# Patient Record
Sex: Male | Born: 1935 | Race: White | Hispanic: No | Smoking: Former smoker
Health system: Southern US, Community
[De-identification: ages and names within clinical notes are randomized; demographics above are authoritative.]

## PROBLEM LIST (undated history)

## (undated) DIAGNOSIS — E079 Disorder of thyroid, unspecified: Secondary | ICD-10-CM

## (undated) DIAGNOSIS — I714 Abdominal aortic aneurysm, without rupture, unspecified: Secondary | ICD-10-CM

## (undated) DIAGNOSIS — R972 Elevated prostate specific antigen [PSA]: Secondary | ICD-10-CM

## (undated) DIAGNOSIS — I498 Other specified cardiac arrhythmias: Secondary | ICD-10-CM

## (undated) DIAGNOSIS — E119 Type 2 diabetes mellitus without complications: Secondary | ICD-10-CM

## (undated) DIAGNOSIS — F0390 Unspecified dementia without behavioral disturbance: Secondary | ICD-10-CM

## (undated) DIAGNOSIS — N400 Enlarged prostate without lower urinary tract symptoms: Secondary | ICD-10-CM

## (undated) DIAGNOSIS — G90A Postural orthostatic tachycardia syndrome (POTS): Secondary | ICD-10-CM

## (undated) DIAGNOSIS — I10 Essential (primary) hypertension: Secondary | ICD-10-CM

## (undated) HISTORY — DX: Essential (primary) hypertension: I10

## (undated) HISTORY — DX: Type 2 diabetes mellitus without complications: E11.9

## (undated) HISTORY — DX: Benign prostatic hyperplasia without lower urinary tract symptoms: N40.0

## (undated) HISTORY — DX: Postural orthostatic tachycardia syndrome (POTS): G90.A

## (undated) HISTORY — DX: Unspecified dementia, unspecified severity, without behavioral disturbance, psychotic disturbance, mood disturbance, and anxiety: F03.90

## (undated) HISTORY — PX: ABDOMINAL AORTIC ANEURYSM REPAIR: SUR1152

## (undated) HISTORY — PX: LUMBAR LAMINECTOMY: SHX95

## (undated) HISTORY — PX: OTHER SURGICAL HISTORY: SHX169

## (undated) HISTORY — PX: APPENDECTOMY: SHX54

## (undated) HISTORY — DX: Other specified cardiac arrhythmias: I49.8

## (undated) HISTORY — DX: Abdominal aortic aneurysm, without rupture, unspecified: I71.40

## (undated) HISTORY — DX: Abdominal aortic aneurysm, without rupture: I71.4

## (undated) HISTORY — DX: Disorder of thyroid, unspecified: E07.9

## (undated) HISTORY — DX: Elevated prostate specific antigen (PSA): R97.20

## (undated) NOTE — *Deleted (*Deleted)
[facility] EMERGENCY DEPARTMENT Provider Note   CSN: [id number] Arrival date & time: 09/02/20  1234     History Chief Complaint  Patient presents with  . Fall    Joe Peters is a 53 y.o. male.  HPI     Past Medical History:  Diagnosis Date  . AAA (abdominal aortic aneurysm) (HCC)   . BPH (benign prostatic hyperplasia)   . Dementia (HCC)   . Diabetes mellitus without complication (HCC)   . Elevated PSA   . Hypertension   . POTS (postural orthostatic tachycardia syndrome)   . Thyroid disease     Patient Active Problem List   Diagnosis Date Noted  . Advanced care planning/counseling discussion   . Goals of care, counseling/discussion   . Palliative care by specialist   . Pressure injury of skin 05/08/2020  . New onset atrial fibrillation (HCC) 05/07/2020  . POTS (postural orthostatic tachycardia syndrome) 05/07/2020  . BPH with urinary obstruction 05/07/2020  . Failure to thrive in adult 05/07/2020  . TIA (transient ischemic attack) 03/04/2020  . HTN (hypertension) 03/04/2020  . DM (diabetes mellitus), secondary, uncontrolled, with neurologic complications (HCC) 03/04/2020    Past Surgical History:  Procedure Laterality Date  . ABDOMINAL AORTIC ANEURYSM REPAIR    . APPENDECTOMY    . IR ANGIO INTRA EXTRACRAN SEL INTERNAL CAROTID BILAT MOD SED  03/07/2020  . IR ANGIO VERTEBRAL SEL SUBCLAVIAN INNOMINATE UNI R MOD SED  03/07/2020  . IR ANGIO VERTEBRAL SEL VERTEBRAL UNI L MOD SED  03/07/2020  . IR US GUIDE VASC ACCESS RIGHT  03/07/2020  . LUMBAR LAMINECTOMY    . peptic ulcer repair         Family History  Problem Relation Age of Onset  . Hypertension Mother   . Hypertension Father     Social History   Tobacco Use  . Smoking status: Former Smoker    Packs/day: 1.00    Years: 25.00    Pack years: 25.00    Types: Cigarettes    Quit date: 12/25/2019    Years since quitting: 0.6  . Smokeless tobacco: Never Used  Vaping Use  . Vaping  Use: Never used  Substance Use Topics  . Alcohol use: Not Currently  . Drug use: Never    Home Medications Prior to Admission medications   Medication Sig Start Date End Date Taking? Authorizing Provider  cilostazol (PLETAL) 50 MG tablet Take 50 mg by mouth 2 (two) times daily.   Yes [provider]  donepezil (ARICEPT) 10 MG tablet Take 10 mg by mouth at bedtime.   Yes [provider]  fenofibrate 54 MG tablet Take 54 mg by mouth daily.   Yes [provider]  gabapentin (NEURONTIN) 100 MG capsule Take 100 mg by mouth 2 (two) times daily.   Yes [provider]  levothyroxine (SYNTHROID) 50 MCG tablet Take 50 mcg by mouth daily before breakfast.   Yes [provider]  simvastatin (ZOCOR) 40 MG tablet Take 40 mg by mouth daily.   Yes [provider]  tamsulosin (FLOMAX) 0.4 MG CAPS capsule Take 0.4 mg by mouth daily.   Yes [provider]  feeding supplement, ENSURE ENLIVE, (ENSURE ENLIVE) LIQD Take 237 mLs by mouth 2 (two) times daily between meals. 05/09/20   Arrien, York Ram, MD  fludrocortisone (FLORINEF) 0.1 MG tablet Take 0.1 mg by mouth daily.  04/22/20   [provider]  levETIRAcetam (KEPPRA) 500 MG tablet Take 1 tablet (  500 mg total) by mouth 2 (two) times daily. 09/02/20   Jacalyn Lefevre, MD  metFORMIN (GLUCOPHAGE) 500 MG tablet Take 500 mg by mouth 2 (two) times daily with a meal.     [provider]  metoprolol tartrate (LOPRESSOR) 25 MG tablet Take 0.5 tablets (12.5 mg total) by mouth 2 (two) times daily. 05/09/20 11/22/20  Arrien, York Ram, MD  sertraline (ZOLOFT) 100 MG tablet Take 100 mg by mouth at bedtime.  04/22/20   [provider]    Allergies    Penicillins and Sulfa antibiotics  Review of Systems   Review of Systems  Physical Exam Updated Vital Signs BP (!) 150/88   Pulse (!) 117   Temp (!) 97.1 F (36.2 C) (Rectal)   Resp 16   SpO2 95%   Physical Exam  ED  Results / Procedures / Treatments   Labs (all labs ordered are listed, but only abnormal results are displayed) Labs Reviewed  CBC WITH DIFFERENTIAL/PLATELET - Abnormal; Notable for the following components:      Result Value   WBC 10.6 (*)    RBC 3.97 (*)    Hemoglobin 12.3 (*)    Neutro Abs 8.2 (*)    All other components within normal limits  COMPREHENSIVE METABOLIC PANEL - Abnormal; Notable for the following components:   Glucose, Bld 199 (*)    Calcium 8.7 (*)    Total Protein 5.9 (*)    Albumin 3.1 (*)    GFR, Estimated 59 (*)    Anion gap 16 (*)    All other components within normal limits  MAGNESIUM - Abnormal; Notable for the following components:   Magnesium 1.4 (*)    All other components within normal limits  URINALYSIS, ROUTINE W REFLEX MICROSCOPIC - Abnormal; Notable for the following components:   APPearance HAZY (*)    Hgb urine dipstick SMALL (*)    Protein, ur 100 (*)    Leukocytes,Ua SMALL (*)    Bacteria, UA MANY (*)    All other components within normal limits  CK - Abnormal; Notable for the following components:   Total CK 37 (*)    All other components within normal limits  LACTIC ACID, PLASMA - Abnormal; Notable for the following components:   Lactic Acid, Venous 6.9 (*)    All other components within normal limits  CBG MONITORING, ED - Abnormal; Notable for the following components:   Glucose-Capillary 176 (*)    All other components within normal limits  RESPIRATORY PANEL BY RT PCR (FLU A&B, COVID)  PROTIME-INR    EKG EKG Interpretation  Date/Time:  Monday September 02 2020 12:37:05 EST Ventricular Rate:  119 PR Interval:    QRS Duration: 139 QT Interval:  392 QTC Calculation: 552 R Axis:   -86 Text Interpretation: Junctional tachycardia RBBB and LAFB Since last tracing rate faster Confirmed by Jacalyn Lefevre [phone]) on 09/02/2020 1:19:23 PM   Radiology CT HEAD WO CONTRAST  Result Date: 09/02/2020 CLINICAL DATA:  New onset seizure  activity today. EXAM: CT HEAD WITHOUT CONTRAST CT CERVICAL SPINE WITHOUT CONTRAST TECHNIQUE: Multidetector CT imaging of the head and cervical spine was performed following the standard protocol without intravenous contrast. Multiplanar CT image reconstructions of the cervical spine were also generated. COMPARISON:  Head CT scan 05/07/2020. FINDINGS: CT HEAD FINDINGS Brain: There is an extra-axial collection over the right cerebral convexities measuring up to 1.4 cm in diameter. The collection is new since the prior head CT but predominantly low  attenuating but does have a few small hyperattenuating areas. The collection results in mass effect on the right convexities but there is no right to left midline shift. No evidence of acute infarct or mass lesion is present. Vascular: Atherosclerosis noted. Skull: Intact.  No focal lesion. Sinuses/Orbits: Status post cataract surgery. Mild mucosal thickening in the maxillary sinuses is seen. The patient is status post maxillary antrostomy. Other: None. CT CERVICAL SPINE FINDINGS Alignment: Reversal of the normal cervical lordosis and 0.2 cm anterolisthesis C7 on T1 due to facet arthropathy noted. Skull base and vertebrae: No acute fracture. No primary bone lesion or focal pathologic process. Soft tissues and spinal canal: No prevertebral fluid or swelling. No visible canal hematoma. Disc levels:  Marked multilevel loss of disc space height. Upper chest: Lung apices demonstrate emphysematous disease. Other: None. IMPRESSION: Subdural hemorrhage on the right appears mainly subacute although there are some areas of acute appearing hemorrhage within it. No right to left midline shift or hydrocephalus. No other acute finding head or cervical spine. Atrophy and chronic microvascular ischemic change. Multilevel cervical spondylosis. Emphysema (ICD10-J43.9). Critical Value/emergent results were called by telephone at the time of interpretation on 09/02/2020 at 1:44 pm to provider  JULIE HAVILAND , who verbally acknowledged these results. Electronically Signed   By: Drusilla Kanner M.D.   On: 09/02/2020 13:53   CT CERVICAL SPINE WO CONTRAST  Result Date: 09/02/2020 CLINICAL DATA:  New onset seizure activity today. EXAM: CT HEAD WITHOUT CONTRAST CT CERVICAL SPINE WITHOUT CONTRAST TECHNIQUE: Multidetector CT imaging of the head and cervical spine was performed following the standard protocol without intravenous contrast. Multiplanar CT image reconstructions of the cervical spine were also generated. COMPARISON:  Head CT scan 05/07/2020. FINDINGS: CT HEAD FINDINGS Brain: There is an extra-axial collection over the right cerebral convexities measuring up to 1.4 cm in diameter. The collection is new since the prior head CT but predominantly low attenuating but does have a few small hyperattenuating areas. The collection results in mass effect on the right convexities but there is no right to left midline shift. No evidence of acute infarct or mass lesion is present. Vascular: Atherosclerosis noted. Skull: Intact.  No focal lesion. Sinuses/Orbits: Status post cataract surgery. Mild mucosal thickening in the maxillary sinuses is seen. The patient is status post maxillary antrostomy. Other: None. CT CERVICAL SPINE FINDINGS Alignment: Reversal of the normal cervical lordosis and 0.2 cm anterolisthesis C7 on T1 due to facet arthropathy noted. Skull base and vertebrae: No acute fracture. No primary bone lesion or focal pathologic process. Soft tissues and spinal canal: No prevertebral fluid or swelling. No visible canal hematoma. Disc levels:  Marked multilevel loss of disc space height. Upper chest: Lung apices demonstrate emphysematous disease. Other: None. IMPRESSION: Subdural hemorrhage on the right appears mainly subacute although there are some areas of acute appearing hemorrhage within it. No right to left midline shift or hydrocephalus. No other acute finding head or cervical spine. Atrophy  and chronic microvascular ischemic change. Multilevel cervical spondylosis. Emphysema (ICD10-J43.9). Critical Value/emergent results were called by telephone at the time of interpretation on 09/02/2020 at 1:44 pm to provider JULIE HAVILAND , who verbally acknowledged these results. Electronically Signed   By: Drusilla Kanner M.D.   On: 09/02/2020 13:53    Procedures Procedures (including critical care time)  Medications Ordered in ED Medications  0.9 %  sodium chloride infusion ( Intravenous New Bag/Given 09/02/20 1257)  LORazepam (ATIVAN) 2 MG/ML injection (1 mg  Given 09/02/20 1252)  levETIRAcetam (KEPPRA) IVPB 1000 mg/100 mL premix (0 mg Intravenous Stopped 09/02/20 1404)    ED Course  I have reviewed the triage vital signs and the nursing notes.  Pertinent labs & imaging results that were available during my care of the patient were reviewed by me and considered in my medical decision making (see chart for details).    MDM Rules/Calculators/A&P                          *** Final Clinical Impression(s) / ED Diagnoses Final diagnoses:  Seizure (HCC)  Subdural hematoma (HCC)  Hospice care    Rx / DC Orders ED Discharge Orders         Ordered    levETIRAcetam (KEPPRA) 500 MG tablet  2 times daily        09/02/20 1500

---

## 2020-03-04 ENCOUNTER — Observation Stay (HOSPITAL_COMMUNITY): Payer: Medicare Other

## 2020-03-04 ENCOUNTER — Encounter (HOSPITAL_COMMUNITY): Payer: Self-pay | Admitting: Radiology

## 2020-03-04 ENCOUNTER — Emergency Department (HOSPITAL_COMMUNITY): Payer: Medicare Other

## 2020-03-04 ENCOUNTER — Inpatient Hospital Stay (HOSPITAL_COMMUNITY)
Admission: EM | Admit: 2020-03-04 | Discharge: 2020-03-10 | DRG: 069 | Disposition: A | Discharge status: Skilled Nursing Facility | Payer: Medicare Other | Source: Skilled Nursing Facility | Attending: Internal Medicine | Admitting: Internal Medicine

## 2020-03-04 DIAGNOSIS — I672 Cerebral atherosclerosis: Secondary | ICD-10-CM | POA: Diagnosis present

## 2020-03-04 DIAGNOSIS — E871 Hypo-osmolality and hyponatremia: Secondary | ICD-10-CM | POA: Diagnosis present

## 2020-03-04 DIAGNOSIS — Z880 Allergy status to penicillin: Secondary | ICD-10-CM

## 2020-03-04 DIAGNOSIS — E785 Hyperlipidemia, unspecified: Secondary | ICD-10-CM | POA: Diagnosis present

## 2020-03-04 DIAGNOSIS — R260 Ataxic gait: Secondary | ICD-10-CM | POA: Diagnosis present

## 2020-03-04 DIAGNOSIS — Z882 Allergy status to sulfonamides status: Secondary | ICD-10-CM

## 2020-03-04 DIAGNOSIS — Z79890 Hormone replacement therapy (postmenopausal): Secondary | ICD-10-CM

## 2020-03-04 DIAGNOSIS — I361 Nonrheumatic tricuspid (valve) insufficiency: Secondary | ICD-10-CM

## 2020-03-04 DIAGNOSIS — F419 Anxiety disorder, unspecified: Secondary | ICD-10-CM | POA: Diagnosis present

## 2020-03-04 DIAGNOSIS — E1142 Type 2 diabetes mellitus with diabetic polyneuropathy: Secondary | ICD-10-CM | POA: Diagnosis present

## 2020-03-04 DIAGNOSIS — R338 Other retention of urine: Secondary | ICD-10-CM | POA: Diagnosis present

## 2020-03-04 DIAGNOSIS — Z794 Long term (current) use of insulin: Secondary | ICD-10-CM

## 2020-03-04 DIAGNOSIS — G9340 Encephalopathy, unspecified: Secondary | ICD-10-CM | POA: Diagnosis present

## 2020-03-04 DIAGNOSIS — G459 Transient cerebral ischemic attack, unspecified: Secondary | ICD-10-CM | POA: Diagnosis not present

## 2020-03-04 DIAGNOSIS — I351 Nonrheumatic aortic (valve) insufficiency: Secondary | ICD-10-CM

## 2020-03-04 DIAGNOSIS — E1165 Type 2 diabetes mellitus with hyperglycemia: Secondary | ICD-10-CM | POA: Diagnosis present

## 2020-03-04 DIAGNOSIS — F329 Major depressive disorder, single episode, unspecified: Secondary | ICD-10-CM | POA: Diagnosis present

## 2020-03-04 DIAGNOSIS — R55 Syncope and collapse: Secondary | ICD-10-CM

## 2020-03-04 DIAGNOSIS — E1349 Other specified diabetes mellitus with other diabetic neurological complication: Secondary | ICD-10-CM

## 2020-03-04 DIAGNOSIS — Z9689 Presence of other specified functional implants: Secondary | ICD-10-CM | POA: Diagnosis present

## 2020-03-04 DIAGNOSIS — E1151 Type 2 diabetes mellitus with diabetic peripheral angiopathy without gangrene: Secondary | ICD-10-CM | POA: Diagnosis present

## 2020-03-04 DIAGNOSIS — F0390 Unspecified dementia without behavioral disturbance: Secondary | ICD-10-CM | POA: Diagnosis present

## 2020-03-04 DIAGNOSIS — Z20822 Contact with and (suspected) exposure to covid-19: Secondary | ICD-10-CM | POA: Diagnosis present

## 2020-03-04 DIAGNOSIS — R296 Repeated falls: Secondary | ICD-10-CM | POA: Diagnosis present

## 2020-03-04 DIAGNOSIS — R4781 Slurred speech: Secondary | ICD-10-CM | POA: Diagnosis not present

## 2020-03-04 DIAGNOSIS — E7800 Pure hypercholesterolemia, unspecified: Secondary | ICD-10-CM | POA: Diagnosis present

## 2020-03-04 DIAGNOSIS — E1365 Other specified diabetes mellitus with hyperglycemia: Secondary | ICD-10-CM

## 2020-03-04 DIAGNOSIS — Z8679 Personal history of other diseases of the circulatory system: Secondary | ICD-10-CM

## 2020-03-04 DIAGNOSIS — R471 Dysarthria and anarthria: Secondary | ICD-10-CM | POA: Diagnosis present

## 2020-03-04 DIAGNOSIS — I129 Hypertensive chronic kidney disease with stage 1 through stage 4 chronic kidney disease, or unspecified chronic kidney disease: Secondary | ICD-10-CM | POA: Diagnosis present

## 2020-03-04 DIAGNOSIS — N1831 Chronic kidney disease, stage 3a: Secondary | ICD-10-CM | POA: Diagnosis present

## 2020-03-04 DIAGNOSIS — Z7982 Long term (current) use of aspirin: Secondary | ICD-10-CM

## 2020-03-04 DIAGNOSIS — N179 Acute kidney failure, unspecified: Secondary | ICD-10-CM | POA: Diagnosis present

## 2020-03-04 DIAGNOSIS — I10 Essential (primary) hypertension: Secondary | ICD-10-CM

## 2020-03-04 DIAGNOSIS — Z6820 Body mass index (BMI) 20.0-20.9, adult: Secondary | ICD-10-CM

## 2020-03-04 DIAGNOSIS — E861 Hypovolemia: Secondary | ICD-10-CM | POA: Diagnosis present

## 2020-03-04 DIAGNOSIS — R2689 Other abnormalities of gait and mobility: Secondary | ICD-10-CM | POA: Diagnosis present

## 2020-03-04 DIAGNOSIS — R000 Tachycardia, unspecified: Secondary | ICD-10-CM | POA: Diagnosis present

## 2020-03-04 DIAGNOSIS — Z79899 Other long term (current) drug therapy: Secondary | ICD-10-CM

## 2020-03-04 DIAGNOSIS — E1122 Type 2 diabetes mellitus with diabetic chronic kidney disease: Secondary | ICD-10-CM | POA: Diagnosis present

## 2020-03-04 DIAGNOSIS — Z8673 Personal history of transient ischemic attack (TIA), and cerebral infarction without residual deficits: Secondary | ICD-10-CM

## 2020-03-04 DIAGNOSIS — IMO0002 Reserved for concepts with insufficient information to code with codable children: Secondary | ICD-10-CM

## 2020-03-04 DIAGNOSIS — N401 Enlarged prostate with lower urinary tract symptoms: Secondary | ICD-10-CM | POA: Diagnosis present

## 2020-03-04 DIAGNOSIS — R29810 Facial weakness: Secondary | ICD-10-CM | POA: Diagnosis present

## 2020-03-04 DIAGNOSIS — Z8249 Family history of ischemic heart disease and other diseases of the circulatory system: Secondary | ICD-10-CM

## 2020-03-04 DIAGNOSIS — E039 Hypothyroidism, unspecified: Secondary | ICD-10-CM | POA: Diagnosis present

## 2020-03-04 LAB — COMPREHENSIVE METABOLIC PANEL
ALT: 13 U/L (ref 0–44)
AST: 17 U/L (ref 15–41)
Albumin: 3.4 g/dL — ABNORMAL LOW (ref 3.5–5.0)
Alkaline Phosphatase: 36 U/L — ABNORMAL LOW (ref 38–126)
Anion gap: 13 (ref 5–15)
BUN: 18 mg/dL (ref 8–23)
CO2: 23 mmol/L (ref 22–32)
Calcium: 9.5 mg/dL (ref 8.9–10.3)
Chloride: 94 mmol/L — ABNORMAL LOW (ref 98–111)
Creatinine, Ser: 1.52 mg/dL — ABNORMAL HIGH (ref 0.61–1.24)
GFR calc Af Amer: 46 mL/min — ABNORMAL LOW (ref >60)
GFR calc non Af Amer: 40 mL/min — ABNORMAL LOW (ref >60)
Glucose, Bld: 259 mg/dL — ABNORMAL HIGH (ref 70–99)
Potassium: 4.2 mmol/L (ref 3.5–5.1)
Sodium: 130 mmol/L — ABNORMAL LOW (ref 135–145)
Total Bilirubin: 1.3 mg/dL — ABNORMAL HIGH (ref 0.3–1.2)
Total Protein: 5.9 g/dL — ABNORMAL LOW (ref 6.5–8.1)

## 2020-03-04 LAB — GLUCOSE, CAPILLARY: Glucose-Capillary: 151 mg/dL — ABNORMAL HIGH (ref 70–99)

## 2020-03-04 LAB — DIFFERENTIAL
Abs Immature Granulocytes: 0.03 10*3/uL (ref 0.00–0.07)
Basophils Absolute: 0.1 10*3/uL (ref 0.0–0.1)
Basophils Relative: 1 %
Eosinophils Absolute: 0.1 10*3/uL (ref 0.0–0.5)
Eosinophils Relative: 1 %
Immature Granulocytes: 0 %
Lymphocytes Relative: 24 %
Lymphs Abs: 1.7 10*3/uL (ref 0.7–4.0)
Monocytes Absolute: 0.5 10*3/uL (ref 0.1–1.0)
Monocytes Relative: 8 %
Neutro Abs: 4.7 10*3/uL (ref 1.7–7.7)
Neutrophils Relative %: 66 %

## 2020-03-04 LAB — CBC
HCT: 39 % (ref 39.0–52.0)
Hemoglobin: 12.9 g/dL — ABNORMAL LOW (ref 13.0–17.0)
MCH: 31.2 pg (ref 26.0–34.0)
MCHC: 33.1 g/dL (ref 30.0–36.0)
MCV: 94.2 fL (ref 80.0–100.0)
Platelets: 208 10*3/uL (ref 150–400)
RBC: 4.14 MIL/uL — ABNORMAL LOW (ref 4.22–5.81)
RDW: 13.3 % (ref 11.5–15.5)
WBC: 7.1 10*3/uL (ref 4.0–10.5)
nRBC: 0 % (ref 0.0–0.2)

## 2020-03-04 LAB — I-STAT CHEM 8, ED
BUN: 20 mg/dL (ref 8–23)
Calcium, Ion: 1.22 mmol/L (ref 1.15–1.40)
Chloride: 92 mmol/L — ABNORMAL LOW (ref 98–111)
Creatinine, Ser: 1.5 mg/dL — ABNORMAL HIGH (ref 0.61–1.24)
Glucose, Bld: 252 mg/dL — ABNORMAL HIGH (ref 70–99)
HCT: 38 % — ABNORMAL LOW (ref 39.0–52.0)
Hemoglobin: 12.9 g/dL — ABNORMAL LOW (ref 13.0–17.0)
Potassium: 4.1 mmol/L (ref 3.5–5.1)
Sodium: 128 mmol/L — ABNORMAL LOW (ref 135–145)
TCO2: 27 mmol/L (ref 22–32)

## 2020-03-04 LAB — SARS CORONAVIRUS 2 BY RT PCR (HOSPITAL ORDER, PERFORMED IN ~~LOC~~ HOSPITAL LAB): SARS Coronavirus 2: NEGATIVE

## 2020-03-04 LAB — ECHOCARDIOGRAM COMPLETE: Weight: 2366.86 oz

## 2020-03-04 LAB — ETHANOL: Alcohol, Ethyl (B): <10 mg/dL (ref <10)

## 2020-03-04 LAB — PROTIME-INR
INR: 1.1 (ref 0.8–1.2)
Prothrombin Time: 13.5 seconds (ref 11.4–15.2)

## 2020-03-04 LAB — CBG MONITORING, ED: Glucose-Capillary: 169 mg/dL — ABNORMAL HIGH (ref 70–99)

## 2020-03-04 LAB — APTT: aPTT: 27 seconds (ref 24–36)

## 2020-03-04 MED ORDER — DONEPEZIL HCL 10 MG PO TABS
10.0000 mg | ORAL_TABLET | Freq: Every day | ORAL | Status: DC
  Administered 2020-03-05 – 2020-03-09 (×5): 10 mg via ORAL
  Filled 2020-03-04 (×5): qty 1

## 2020-03-04 MED ORDER — SENNOSIDES-DOCUSATE SODIUM 8.6-50 MG PO TABS: 1.0000 | ORAL_TABLET | Freq: Every evening | ORAL | Status: DC | PRN

## 2020-03-04 MED ORDER — GABAPENTIN 100 MG PO CAPS
100.0000 mg | ORAL_CAPSULE | Freq: Two times a day (BID) | ORAL | Status: DC
  Administered 2020-03-04 – 2020-03-10 (×12): 100 mg via ORAL
  Filled 2020-03-04 (×12): qty 1

## 2020-03-04 MED ORDER — SERTRALINE HCL 50 MG PO TABS
50.0000 mg | ORAL_TABLET | Freq: Every day | ORAL | Status: DC
  Administered 2020-03-05 – 2020-03-10 (×6): 50 mg via ORAL
  Filled 2020-03-04 (×6): qty 1

## 2020-03-04 MED ORDER — ACETAMINOPHEN 650 MG RE SUPP: 650.0000 mg | RECTAL | Status: DC | PRN

## 2020-03-04 MED ORDER — IOHEXOL 350 MG/ML SOLN
50.0000 mL | Freq: Once | INTRAVENOUS | Status: AC | PRN
  Administered 2020-03-04: 50 mL via INTRAVENOUS

## 2020-03-04 MED ORDER — ACETAMINOPHEN 160 MG/5ML PO SOLN: 650.0000 mg | ORAL | Status: DC | PRN

## 2020-03-04 MED ORDER — ACETAMINOPHEN 325 MG PO TABS: 650.0000 mg | ORAL_TABLET | ORAL | Status: DC | PRN

## 2020-03-04 MED ORDER — CILOSTAZOL 50 MG PO TABS
50.0000 mg | ORAL_TABLET | Freq: Two times a day (BID) | ORAL | Status: DC
  Administered 2020-03-04 – 2020-03-10 (×12): 50 mg via ORAL
  Filled 2020-03-04 (×13): qty 1

## 2020-03-04 MED ORDER — HEPARIN SODIUM (PORCINE) 5000 UNIT/ML IJ SOLN
5000.0000 [IU] | Freq: Two times a day (BID) | INTRAMUSCULAR | Status: AC
  Administered 2020-03-04 – 2020-03-06 (×5): 5000 [IU] via SUBCUTANEOUS
  Filled 2020-03-04 (×5): qty 1

## 2020-03-04 MED ORDER — HYDRALAZINE HCL 25 MG PO TABS: 25.0000 mg | ORAL_TABLET | Freq: Four times a day (QID) | ORAL | Status: DC | PRN

## 2020-03-04 MED ORDER — INSULIN ASPART 100 UNIT/ML ~~LOC~~ SOLN
0.0000 [IU] | Freq: Three times a day (TID) | SUBCUTANEOUS | Status: DC
  Administered 2020-03-04 – 2020-03-05 (×3): 2 [IU] via SUBCUTANEOUS
  Administered 2020-03-06: 3 [IU] via SUBCUTANEOUS
  Administered 2020-03-06 – 2020-03-07 (×2): 2 [IU] via SUBCUTANEOUS
  Administered 2020-03-08: 3 [IU] via SUBCUTANEOUS
  Administered 2020-03-08 – 2020-03-09 (×2): 2 [IU] via SUBCUTANEOUS
  Administered 2020-03-09: 1 [IU] via SUBCUTANEOUS
  Administered 2020-03-09: 2 [IU] via SUBCUTANEOUS
  Administered 2020-03-10: 5 [IU] via SUBCUTANEOUS

## 2020-03-04 MED ORDER — LEVOTHYROXINE SODIUM 50 MCG PO TABS
50.0000 ug | ORAL_TABLET | Freq: Every day | ORAL | Status: DC
  Administered 2020-03-05 – 2020-03-10 (×6): 50 ug via ORAL
  Filled 2020-03-04 (×6): qty 1

## 2020-03-04 MED ORDER — FENOFIBRATE 54 MG PO TABS
54.0000 mg | ORAL_TABLET | Freq: Every day | ORAL | Status: DC
  Administered 2020-03-05 – 2020-03-10 (×6): 54 mg via ORAL
  Filled 2020-03-04 (×6): qty 1

## 2020-03-04 MED ORDER — SODIUM CHLORIDE 0.9 % IV SOLN: INTRAVENOUS | Status: AC

## 2020-03-04 MED ORDER — ATORVASTATIN CALCIUM 80 MG PO TABS
80.0000 mg | ORAL_TABLET | Freq: Every day | ORAL | Status: DC
  Administered 2020-03-04 – 2020-03-05 (×2): 80 mg via ORAL
  Filled 2020-03-04 (×2): qty 1

## 2020-03-04 MED ORDER — INSULIN GLARGINE 100 UNIT/ML ~~LOC~~ SOLN
10.0000 [IU] | Freq: Every day | SUBCUTANEOUS | Status: DC
  Administered 2020-03-04: 10 [IU] via SUBCUTANEOUS
  Filled 2020-03-04 (×2): qty 0.1

## 2020-03-04 MED ORDER — TAMSULOSIN HCL 0.4 MG PO CAPS
0.4000 mg | ORAL_CAPSULE | Freq: Every day | ORAL | Status: DC
  Administered 2020-03-05 – 2020-03-10 (×6): 0.4 mg via ORAL
  Filled 2020-03-04 (×8): qty 1

## 2020-03-04 MED ORDER — STROKE: EARLY STAGES OF RECOVERY BOOK
Freq: Once | Status: AC
  Filled 2020-03-04: qty 1

## 2020-03-04 MED ORDER — ASPIRIN EC 81 MG PO TBEC
81.0000 mg | DELAYED_RELEASE_TABLET | Freq: Every day | ORAL | Status: DC
  Administered 2020-03-04 – 2020-03-05 (×2): 81 mg via ORAL
  Filled 2020-03-04 (×2): qty 1

## 2020-03-04 NOTE — ED Triage Notes (Signed)
Pt bib ems from [facility] with sudden onset of slurred speech, gait abnormalities and dizziness onset 1000. Code stroke activated in field. 108/64, HR 100 sinus with PAC's, RR 17, 97% RA.

## 2020-03-04 NOTE — ED Provider Notes (Signed)
[facility] EMERGENCY DEPARTMENT Provider Note   CSN: [id number] Arrival date & time: 03/04/20  1105  An emergency department physician performed an initial assessment on this suspected stroke patient at 1114(in CT).  History Chief Complaint  Patient presents with  . Code Stroke    Joe Peters is a 86 y.o. male.  HPI Patient brought to ED from physical therapy where he reportedly had onset of slurred speech. He has no history of prior stroke. He denies any other complaints.     History reviewed. No pertinent past medical history.  Patient Active Problem List   Diagnosis Date Noted  . TIA (transient ischemic attack) 03/04/2020  . HTN (hypertension) 03/04/2020  . DM (diabetes mellitus), secondary, uncontrolled, with neurologic complications (HCC) 03/04/2020         Family History  Problem Relation Age of Onset  . Hypertension Mother   . Hypertension Father     Social History   Tobacco Use  . Smoking status: Not on file  Substance Use Topics  . Alcohol use: Not on file  . Drug use: Not on file    Home Medications Prior to Admission medications   Medication Sig Start Date End Date Taking? Authorizing Provider  cilostazol (PLETAL) 50 MG tablet Take 50 mg by mouth 2 (two) times daily.   Yes [provider]  donepezil (ARICEPT) 10 MG tablet Take 10 mg by mouth at bedtime.   Yes [provider]  fenofibrate 54 MG tablet Take 54 mg by mouth daily.   Yes [provider]  gabapentin (NEURONTIN) 100 MG capsule Take 100 mg by mouth 2 (two) times daily.   Yes [provider]  insulin glargine (LANTUS) 100 unit/mL SOPN Inject 10 Units into the skin at bedtime.   Yes [provider]  levothyroxine (SYNTHROID) 50 MCG tablet Take 50 mcg by mouth daily before breakfast.   Yes [provider]  losartan-hydrochlorothiazide (HYZAAR) 100-25 MG tablet Take 1 tablet by mouth daily.   Yes [provider]  metFORMIN (GLUCOPHAGE) 500 MG tablet Take 500 mg by mouth in the morning and at bedtime.   Yes [provider]  sertraline (ZOLOFT) 50 MG tablet Take 50 mg by mouth daily.   Yes [provider]  simvastatin (ZOCOR) 40 MG tablet Take 40 mg by mouth daily.   Yes [provider]  tamsulosin (FLOMAX) 0.4 MG CAPS capsule Take 0.4 mg by mouth daily.   Yes [provider]    Allergies    Penicillins and Sulfa antibiotics  Review of Systems   Review of Systems A comprehensive review of systems was completed and negative except as noted in HPI.   Physical Exam Updated Vital Signs BP (!) 156/79 (BP Location: Right Arm)   Pulse 72   Temp 97.9 F (36.6 C) (Oral)   Resp 18   Ht 5\' 11"  (1.803 m)   Wt 67.1 kg   SpO2 100%   BMI 20.63 kg/m   Physical Exam Vitals and nursing note reviewed.  Constitutional:      Appearance: Normal appearance.  HENT:     Head: Normocephalic and atraumatic.     Nose: Nose normal.     Mouth/Throat:     Mouth: Mucous membranes are moist.  Eyes:     Extraocular Movements: Extraocular movements intact.     Conjunctiva/sclera: Conjunctivae normal.  Cardiovascular:     Rate and Rhythm: Normal rate.  Pulmonary:     Effort: Pulmonary  effort is normal.     Breath sounds: Normal breath sounds.  Abdominal:     General: Abdomen is flat.     Palpations: Abdomen is soft.     Tenderness: There is no abdominal tenderness.  Musculoskeletal:        General: No swelling. Normal range of motion.     Cervical back: Neck supple.  Skin:    General: Skin is warm and dry.  Neurological:     Mental Status: He is alert.     Comments: Mild flattening of R nasolabial fold, intermittent dysarthria, for full NIHSS please see Neuro assessment  Psychiatric:        Mood and Affect: Mood normal.     ED Results / Procedures / Treatments   Labs (all labs ordered are listed, but only abnormal results are displayed) Labs Reviewed  CBC -  Abnormal; Notable for the following components:      Result Value   RBC 4.14 (*)    Hemoglobin 12.9 (*)    All other components within normal limits  COMPREHENSIVE METABOLIC PANEL - Abnormal; Notable for the following components:   Sodium 130 (*)    Chloride 94 (*)    Glucose, Bld 259 (*)    Creatinine, Ser 1.52 (*)    Total Protein 5.9 (*)    Albumin 3.4 (*)    Alkaline Phosphatase 36 (*)    Total Bilirubin 1.3 (*)    GFR calc non Af Amer 40 (*)    GFR calc Af Amer 46 (*)    All other components within normal limits  I-STAT CHEM 8, ED - Abnormal; Notable for the following components:   Sodium 128 (*)    Chloride 92 (*)    Creatinine, Ser 1.50 (*)    Glucose, Bld 252 (*)    Hemoglobin 12.9 (*)    HCT 38.0 (*)    All other components within normal limits  CBG MONITORING, ED - Abnormal; Notable for the following components:   Glucose-Capillary 169 (*)    All other components within normal limits  SARS CORONAVIRUS 2 BY RT PCR (HOSPITAL ORDER, [facility])  ETHANOL  PROTIME-INR  APTT  DIFFERENTIAL  RAPID URINE DRUG SCREEN, HOSP PERFORMED  URINALYSIS, ROUTINE W REFLEX MICROSCOPIC  HEMOGLOBIN A1C  LIPID PANEL  BASIC METABOLIC PANEL    EKG EKG Interpretation  Date/Time:  Monday Mar 04 2020 11:33:51 EDT Ventricular Rate:  77 PR Interval:    QRS Duration: 139 QT Interval:  404 QTC Calculation: 458 R Axis:   -61 Text Interpretation: Sinus arhythmia RBBB and LAFB Non-specific ST-t changes No old tracing to compare Confirmed by Calvert Cantor [phone]) on 03/04/2020 11:54:36 AM   Radiology CT ANGIO HEAD W OR WO CONTRAST  Result Date: 03/04/2020 CLINICAL DATA:  Code stroke follow-up EXAM: CT ANGIOGRAPHY HEAD AND NECK TECHNIQUE: Multidetector CT imaging of the head and neck was performed using the standard protocol during bolus administration of intravenous contrast. Multiplanar CT image reconstructions and MIPs were obtained to evaluate the vascular  anatomy. Carotid stenosis measurements (when applicable) are obtained utilizing NASCET criteria, using the distal internal carotid diameter as the denominator. CONTRAST:  72mL OMNIPAQUE IOHEXOL 350 MG/ML SOLN COMPARISON:  None. FINDINGS: CTA NECK FINDINGS Aortic arch: Calcified and noncalcified plaque along the arch. Great vessel origins are patent. Circumferential plaque at the left common carotid origin causing less than 50% stenosis. Right carotid system: Patent. Minimal calcified plaque at the ICA origin without  measurable stenosis. Left carotid system: Patent. Calcified and noncalcified plaque along the proximal internal carotid causing less than 50% stenosis. Vertebral arteries: Patent. Plaque at the right vertebral origin causes mild stenosis. There is focal severe stenosis of the left vertebral artery at the C3-C4 level. Otherwise mild multifocal irregularity related to cervical spine degenerative changes. Skeleton: Degenerative changes of the cervical spine Other neck: No mass or adenopathy. Upper chest: No apical lung mass. Review of the MIP images confirms the above findings CTA HEAD FINDINGS Anterior circulation: Intracranial internal carotid arteries are patent with mild calcified plaque. Anterior cerebral arteries are patent with anterior communicating artery present. Tandem moderate to marked stenoses of the right A2 ACA. Irregularity of the left A3 ACA with focal marked stenosis. Middle cerebral arteries are patent. Moderate to marked stenosis of the distal left M1 MCA extending into M2 branch with diminished flow. Additional multifocal bilateral distal MCA stenoses. Posterior circulation: Intracranial vertebral arteries, basilar artery, and posterior cerebral arteries are patent. Atherosclerotic irregularity of both posterior cerebral arteries with multiple moderate and marked stenoses of the P2 segments Venous sinuses: As permitted by contrast timing, patent. Review of the MIP images confirms the  above findings IMPRESSION: No large vessel occlusion. Plaque at the proximal left ICA causing less than 50% stenosis. No measurable stenosis at the right ICA origin. Focal severe stenosis of the left V2 vertebral artery without apparent distal flow limitation. Significant intracranial atherosclerosis as detailed above involving anterior and posterior circulations. Preliminary results were communicated to Dr. Amada Jupiter at 11:31 amon 5/10/2021by text page via the [facility] messaging system. Electronically Signed   By: Guadlupe Spanish M.D.   On: 03/04/2020 11:53   DG Chest 2 View  Result Date: 03/04/2020 CLINICAL DATA:  Slurred speech, dizziness EXAM: CHEST - 2 VIEW COMPARISON:  None. FINDINGS: Heart size is normal. Atherosclerotic calcification of the aortic knob. Prior descending aortic stent graft. There is a well-circumscribed 6.4 cm ovoid density adjacent to the mid descending thoracic aorta, which could potentially reflect an excluded aneurysm sac given the presence of the adjacent stent graft. No focal airspace consolidation, pleural effusion, or pneumothorax. No acute osseous findings. IMPRESSION: Smoothly marginated 6.4 cm ovoid density adjacent to the mid descending thoracic aorta, which could potentially reflect an excluded aneurysm sac given the presence of the adjacent stent graft. Further evaluation with contrast-enhanced chest CT is recommended if there are no available outside prior studies. Electronically Signed   By: Duanne Guess D.O.   On: 03/04/2020 13:50   CT ANGIO NECK W OR WO CONTRAST  Result Date: 03/04/2020 CLINICAL DATA:  Code stroke follow-up EXAM: CT ANGIOGRAPHY HEAD AND NECK TECHNIQUE: Multidetector CT imaging of the head and neck was performed using the standard protocol during bolus administration of intravenous contrast. Multiplanar CT image reconstructions and MIPs were obtained to evaluate the vascular anatomy. Carotid stenosis measurements (when applicable) are obtained  utilizing NASCET criteria, using the distal internal carotid diameter as the denominator. CONTRAST:  23mL OMNIPAQUE IOHEXOL 350 MG/ML SOLN COMPARISON:  None. FINDINGS: CTA NECK FINDINGS Aortic arch: Calcified and noncalcified plaque along the arch. Great vessel origins are patent. Circumferential plaque at the left common carotid origin causing less than 50% stenosis. Right carotid system: Patent. Minimal calcified plaque at the ICA origin without measurable stenosis. Left carotid system: Patent. Calcified and noncalcified plaque along the proximal internal carotid causing less than 50% stenosis. Vertebral arteries: Patent. Plaque at the right vertebral origin causes mild stenosis. There is focal severe stenosis of  the left vertebral artery at the C3-C4 level. Otherwise mild multifocal irregularity related to cervical spine degenerative changes. Skeleton: Degenerative changes of the cervical spine Other neck: No mass or adenopathy. Upper chest: No apical lung mass. Review of the MIP images confirms the above findings CTA HEAD FINDINGS Anterior circulation: Intracranial internal carotid arteries are patent with mild calcified plaque. Anterior cerebral arteries are patent with anterior communicating artery present. Tandem moderate to marked stenoses of the right A2 ACA. Irregularity of the left A3 ACA with focal marked stenosis. Middle cerebral arteries are patent. Moderate to marked stenosis of the distal left M1 MCA extending into M2 branch with diminished flow. Additional multifocal bilateral distal MCA stenoses. Posterior circulation: Intracranial vertebral arteries, basilar artery, and posterior cerebral arteries are patent. Atherosclerotic irregularity of both posterior cerebral arteries with multiple moderate and marked stenoses of the P2 segments Venous sinuses: As permitted by contrast timing, patent. Review of the MIP images confirms the above findings IMPRESSION: No large vessel occlusion. Plaque at the  proximal left ICA causing less than 50% stenosis. No measurable stenosis at the right ICA origin. Focal severe stenosis of the left V2 vertebral artery without apparent distal flow limitation. Significant intracranial atherosclerosis as detailed above involving anterior and posterior circulations. Preliminary results were communicated to Dr. Amada JupiterKirkpatrick at 11:31 amon 5/10/2021by text page via the [facility] messaging system. Electronically Signed   By: Guadlupe SpanishPraneil  Patel M.D.   On: 03/04/2020 11:53   CT HEAD CODE STROKE WO CONTRAST  Result Date: 03/04/2020 CLINICAL DATA:  Code stroke.  Dysarthria, facial droop EXAM: CT HEAD WITHOUT CONTRAST TECHNIQUE: Contiguous axial images were obtained from the base of the skull through the vertex without intravenous contrast. COMPARISON:  None. FINDINGS: Brain: There is no acute intracranial hemorrhage, mass effect, or edema. Gray-white differentiation is preserved. Prominence of the ventricles and sulci reflects generalized parenchymal volume loss. There is no extra-axial fluid collection. Patchy hypoattenuation in the supratentorial white matter is nonspecific but may reflect mild chronic microvascular ischemic changes. Vascular: No hyperdense vessel. There is intracranial atherosclerotic calcification at the skull base. Skull: Unremarkable Sinuses/Orbits: . minor mucosal thickening. Orbits are unremarkable. Other: Mastoid air cells are clear. ASPECTS ([place] Stroke Program Early CT Score) - Ganglionic level infarction (caudate, lentiform nuclei, internal capsule, insula, M1-M3 cortex): 7 - Supraganglionic infarction (M4-M6 cortex): 3 Total score (0-10 with 10 being normal): 10 IMPRESSION: No acute intracranial hemorrhage or evidence of acute infarction. ASPECT score is 10. Chronic microvascular ischemic changes. These results were communicated to Dr. Amada JupiterKirkpatrick at 11:21 amon 5/10/2021by text page via the [facility] messaging system. Electronically Signed   By: Guadlupe SpanishPraneil  Patel M.D.    On: 03/04/2020 11:23    Procedures Procedures (including critical care time)  Medications Ordered in ED Medications  fenofibrate tablet 54 mg (has no administration in time range)  atorvastatin (LIPITOR) tablet 80 mg (has no administration in time range)  donepezil (ARICEPT) tablet 10 mg (has no administration in time range)  sertraline (ZOLOFT) tablet 50 mg (has no administration in time range)  insulin glargine (LANTUS) injection 10 Units (has no administration in time range)  levothyroxine (SYNTHROID) tablet 50 mcg (has no administration in time range)  tamsulosin (FLOMAX) capsule 0.4 mg (has no administration in time range)  cilostazol (PLETAL) tablet 50 mg (has no administration in time range)  gabapentin (NEURONTIN) capsule 100 mg (has no administration in time range)   stroke: mapping our early stages of recovery book (has no administration in time range)  0.9 %  sodium chloride infusion (has no administration in time range)  acetaminophen (TYLENOL) tablet 650 mg (has no administration in time range)    Or  acetaminophen (TYLENOL) 160 MG/5ML solution 650 mg (has no administration in time range)    Or  acetaminophen (TYLENOL) suppository 650 mg (has no administration in time range)  senna-docusate (Senokot-S) tablet 1 tablet (has no administration in time range)  heparin injection 5,000 Units (has no administration in time range)  hydrALAZINE (APRESOLINE) tablet 25 mg (has no administration in time range)  aspirin EC tablet 81 mg (has no administration in time range)  insulin aspart (novoLOG) injection 0-9 Units (has no administration in time range)  iohexol (OMNIPAQUE) 350 MG/ML injection 50 mL (50 mLs Intravenous Contrast Given 03/04/20 1129)    ED Course  I have reviewed the triage vital signs and the nursing notes.  Pertinent labs & imaging results that were available during my care of the patient were reviewed by me and considered in my medical decision making (see chart  for details).  Clinical Course as of Mar 04 1853  Mon Mar 04, 2020  1154 Neuro team at bedside promptly on the patient arrival. Not a candidate for tPA given mild symptoms. Cannot have MRI due to prior orthopedic repair of RLE in the 1950s after an injury in [place]. Will admit medicine.    [CS]  1234 Spoke with the hospitalist who will evaluate for admission.    [CS]    Clinical Course User Index [CS] Pollyann Savoy, MD   MDM Rules/Calculators/A&P                       Final Clinical Impression(s) / ED Diagnoses Final diagnoses:  TIA (transient ischemic attack)    Rx / DC Orders ED Discharge Orders    None       Pollyann Savoy, MD 03/04/20 [phone]

## 2020-03-04 NOTE — Progress Notes (Signed)
Patient alert oriented X 3, denies pain admitted from the ED, VS within normal range. Patient is in bed locked at lower level, bedside table, call light and telephone within reach. Will continue to monitor patient.

## 2020-03-04 NOTE — ED Notes (Signed)
Dinner ordered 

## 2020-03-04 NOTE — Code Documentation (Signed)
Stroke Response Nurse Documentation Code Documentation  Joe Peters is a 86 y.o. male arriving to [facility] ED via [facility] on 03/04/20 with past medical hx of diabetes, TIA, hypothyroidism, urinary retention, BPH, and depression. Code stroke was activated by EMS.   Patient from [place] where he was LKW at 1000. He was working with OT when he noticed difficulty with balance and slurred speech. Uses walker at baseline. EMS reported unstable balance, slurred speech, dizziness and R eye nystagmus.    Stroke team at the bedside on patient arrival. Labs drawn. Patient to CT with team. NIHSS 1, see documentation for details and code stroke times. Patient with right facial droop on exam. The following imaging was completed:  CT and CTA head and neck. Patient is not a candidate for tPA due to mild to treat. Care/Plan Q30 min VS & neuro checks until out of the window at 1430. Plan for MRI if able. Patient states he has rod in his leg from [place]. Bedside handoff with ED RN Aggie Cosier.    Ferman Hamming Stroke Response RN

## 2020-03-04 NOTE — ED Notes (Signed)
[place] POA [phone]

## 2020-03-04 NOTE — Plan of Care (Signed)
  Problem: Consults Goal: RH STROKE PATIENT EDUCATION Description: See Patient Education module for education specifics  Outcome: Progressing Goal: Diabetes Guidelines if Diabetic/Glucose > 140 Description: If diabetic or lab glucose is > 140 mg/dl - Initiate Diabetes/Hyperglycemia Guidelines & Document Interventions  Outcome: Progressing   Problem: RH BLADDER ELIMINATION Goal: RH STG MANAGE BLADDER WITH ASSISTANCE Description: STG Manage Bladder With Assistance Outcome: Progressing   Problem: RH SKIN INTEGRITY Goal: RH STG MAINTAIN SKIN INTEGRITY WITH ASSISTANCE Description: STG Maintain Skin Integrity With Assistance. Outcome: Progressing

## 2020-03-04 NOTE — H&P (Signed)
History and Physical    Joe Peters TMH:[id number] DOB: 06-Mar-1928 DOA: 03/04/2020  PCP: Marton Redwood, MD   Patient coming from: [facility]  I have personally briefly reviewed patient's old medical records in [place]  Chief Complaint: I lost my balance  HPI: Joe Peters is a 86 y.o. male with medical history significant of HTN, IDDM, PVD, diabetic neuropathy, AAA rupture status post repair, HLD, hypothyroidism, brought in for code stroke.  Patient was at assisted living facility, has chronic ambulation dysfunction using a walker intermittently.  This morning while during a regular physical therapy, staff noticed patient suddenly stopped walking and became very wobbly and about to fall, meantime seemed was unable to verbalize for few minutes. They were able to hold him and no fall happened. Patient remembered as he felt like he was about to fall, and he wanted to ask for help but could not talk despite normal understanding and forming idea his mind. EMS on route noticed patient has intermittent slurred speech.  Patient denies any lightheaded or blurred vision, no chest pain or palpitations during this episode.  He was recently moved from [place] to join her daughter who works at his current staying facility as a Marine scientist. ED Course: Patient is a [place] War veteran, has broken bone and repaired with some unknown metals in his legs in 1950s. MRI unable to perform. CTA: No large vessel occlusion. Focal severe stenosis of the left V2 vertebral artery without apparent distal flow limitation.  Significant intracranial atherosclerosisCT head:No acute intracranial hemorrhage or evidence of acute infarction.  Review of Systems: As per HPI otherwise 10 point review of systems negative.    History reviewed.  As mentioned in HPI   has no history on file for tobacco, alcohol, and drug.  Allergies  Allergen Reactions  . Penicillins Other (See Comments)    Reaction  unknown -- occurred in childhood  . Sulfa Antibiotics Other (See Comments)    Reaction unknown -- occurred in childhood    Family History  Problem Relation Age of Onset  . Hypertension Mother   . Hypertension Father     Prior to Admission medications   Medication Sig Start Date End Date Taking? Authorizing Provider  cilostazol (PLETAL) 50 MG tablet Take 50 mg by mouth 2 (two) times daily.   Yes [provider]  donepezil (ARICEPT) 10 MG tablet Take 10 mg by mouth at bedtime.   Yes [provider]  fenofibrate 54 MG tablet Take 54 mg by mouth daily.   Yes [provider]  gabapentin (NEURONTIN) 100 MG capsule Take 100 mg by mouth 2 (two) times daily.   Yes [provider]  insulin glargine (LANTUS) 100 unit/mL SOPN Inject 10 Units into the skin at bedtime.   Yes [provider]  levothyroxine (SYNTHROID) 50 MCG tablet Take 50 mcg by mouth daily before breakfast.   Yes [provider]  losartan-hydrochlorothiazide (HYZAAR) 100-25 MG tablet Take 1 tablet by mouth daily.   Yes [provider]  metFORMIN (GLUCOPHAGE) 500 MG tablet Take 500 mg by mouth in the morning and at bedtime.   Yes [provider]  sertraline (ZOLOFT) 50 MG tablet Take 50 mg by mouth daily.   Yes [provider]  simvastatin (ZOCOR) 40 MG tablet Take 40 mg by mouth daily.   Yes [provider]  tamsulosin (FLOMAX) 0.4 MG CAPS capsule Take 0.4 mg by mouth daily.   Yes [provider]    Physical  Exam: Vitals:   03/04/20 1100 03/04/20 1133 03/04/20 1135  BP:  140/64   Pulse:   72  Resp:   20  Temp:   97.6 F (36.4 C)  TempSrc:   Axillary  SpO2:   100%  Weight: 67.1 kg      Constitutional: NAD, calm, comfortable Vitals:   03/04/20 1100 03/04/20 1133 03/04/20 1135  BP:  140/64   Pulse:   72  Resp:   20  Temp:   97.6 F (36.4 C)  TempSrc:   Axillary  SpO2:   100%  Weight: 67.1 kg     Eyes: PERRL, lids and  conjunctivae normal ENMT: Mucous membranes are moist. Posterior pharynx clear of any exudate or lesions.Normal dentition.  Neck: normal, supple, no masses, no thyromegaly Respiratory: clear to auscultation bilaterally, no wheezing, no crackles. Normal respiratory effort. No accessory muscle use.  Cardiovascular: Regular rate and rhythm, no murmurs / rubs / gallops. No extremity edema. 2+ pedal pulses. No carotid bruits.  Abdomen: no tenderness, no masses palpated. No hepatosplenomegaly. Bowel sounds positive.  Musculoskeletal: no clubbing / cyanosis. No joint deformity upper and lower extremities. Good ROM, no contractures. Normal muscle tone.  Skin: no rashes, lesions, ulcers. No induration Neurologic: CN 2-12 grossly intact. Sensation intact, DTR normal. Strength 5/5 in all 4.  Psychiatric: Normal judgment and insight. Alert and oriented x 3. Normal mood.     Labs on Admission: I have personally reviewed following labs and imaging studies  CBC: Recent Labs  Lab 03/04/20 1110 03/04/20 1112  WBC 7.1  --   NEUTROABS 4.7  --   HGB 12.9* 12.9*  HCT 39.0 38.0*  MCV 94.2  --   PLT 208  --    Basic Metabolic Panel: Recent Labs  Lab 03/04/20 1110 03/04/20 1112  NA 130* 128*  K 4.2 4.1  CL 94* 92*  CO2 23  --   GLUCOSE 259* 252*  BUN 18 20  CREATININE 1.52* 1.50*  CALCIUM 9.5  --    GFR: CrCl cannot be calculated (Unknown ideal weight.). Liver Function Tests: Recent Labs  Lab 03/04/20 1110  AST 17  ALT 13  ALKPHOS 36*  BILITOT 1.3*  PROT 5.9*  ALBUMIN 3.4*   No results for input(s): LIPASE, AMYLASE in the last 168 hours. No results for input(s): AMMONIA in the last 168 hours. Coagulation Profile: Recent Labs  Lab 03/04/20 1110  INR 1.1   Cardiac Enzymes: No results for input(s): CKTOTAL, CKMB, CKMBINDEX, TROPONINI in the last 168 hours. BNP (last 3 results) No results for input(s): PROBNP in the last 8760 hours. HbA1C: No results for input(s): HGBA1C in the  last 72 hours. CBG: No results for input(s): GLUCAP in the last 168 hours. Lipid Profile: No results for input(s): CHOL, HDL, LDLCALC, TRIG, CHOLHDL, LDLDIRECT in the last 72 hours. Thyroid Function Tests: No results for input(s): TSH, T4TOTAL, FREET4, T3FREE, THYROIDAB in the last 72 hours. Anemia Panel: No results for input(s): VITAMINB12, FOLATE, FERRITIN, TIBC, IRON, RETICCTPCT in the last 72 hours. Urine analysis: No results found for: COLORURINE, APPEARANCEUR, LABSPEC, PHURINE, GLUCOSEU, HGBUR, BILIRUBINUR, KETONESUR, PROTEINUR, UROBILINOGEN, NITRITE, LEUKOCYTESUR  Radiological Exams on Admission: CT ANGIO HEAD W OR WO CONTRAST  Result Date: 03/04/2020 CLINICAL DATA:  Code stroke follow-up EXAM: CT ANGIOGRAPHY HEAD AND NECK TECHNIQUE: Multidetector CT imaging of the head and neck was performed using the standard protocol during bolus administration of intravenous contrast. Multiplanar CT image reconstructions and MIPs were obtained to evaluate the  vascular anatomy. Carotid stenosis measurements (when applicable) are obtained utilizing NASCET criteria, using the distal internal carotid diameter as the denominator. CONTRAST:  43mL OMNIPAQUE IOHEXOL 350 MG/ML SOLN COMPARISON:  None. FINDINGS: CTA NECK FINDINGS Aortic arch: Calcified and noncalcified plaque along the arch. Great vessel origins are patent. Circumferential plaque at the left common carotid origin causing less than 50% stenosis. Right carotid system: Patent. Minimal calcified plaque at the ICA origin without measurable stenosis. Left carotid system: Patent. Calcified and noncalcified plaque along the proximal internal carotid causing less than 50% stenosis. Vertebral arteries: Patent. Plaque at the right vertebral origin causes mild stenosis. There is focal severe stenosis of the left vertebral artery at the C3-C4 level. Otherwise mild multifocal irregularity related to cervical spine degenerative changes. Skeleton: Degenerative  changes of the cervical spine Other neck: No mass or adenopathy. Upper chest: No apical lung mass. Review of the MIP images confirms the above findings CTA HEAD FINDINGS Anterior circulation: Intracranial internal carotid arteries are patent with mild calcified plaque. Anterior cerebral arteries are patent with anterior communicating artery present. Tandem moderate to marked stenoses of the right A2 ACA. Irregularity of the left A3 ACA with focal marked stenosis. Middle cerebral arteries are patent. Moderate to marked stenosis of the distal left M1 MCA extending into M2 branch with diminished flow. Additional multifocal bilateral distal MCA stenoses. Posterior circulation: Intracranial vertebral arteries, basilar artery, and posterior cerebral arteries are patent. Atherosclerotic irregularity of both posterior cerebral arteries with multiple moderate and marked stenoses of the P2 segments Venous sinuses: As permitted by contrast timing, patent. Review of the MIP images confirms the above findings IMPRESSION: No large vessel occlusion. Plaque at the proximal left ICA causing less than 50% stenosis. No measurable stenosis at the right ICA origin. Focal severe stenosis of the left V2 vertebral artery without apparent distal flow limitation. Significant intracranial atherosclerosis as detailed above involving anterior and posterior circulations. Preliminary results were communicated to Dr. Amada Jupiter at 11:31 amon 5/10/2021by text page via the [facility] messaging system. Electronically Signed   By: Guadlupe Spanish M.D.   On: 03/04/2020 11:53   CT ANGIO NECK W OR WO CONTRAST  Result Date: 03/04/2020 CLINICAL DATA:  Code stroke follow-up EXAM: CT ANGIOGRAPHY HEAD AND NECK TECHNIQUE: Multidetector CT imaging of the head and neck was performed using the standard protocol during bolus administration of intravenous contrast. Multiplanar CT image reconstructions and MIPs were obtained to evaluate the vascular anatomy. Carotid  stenosis measurements (when applicable) are obtained utilizing NASCET criteria, using the distal internal carotid diameter as the denominator. CONTRAST:  76mL OMNIPAQUE IOHEXOL 350 MG/ML SOLN COMPARISON:  None. FINDINGS: CTA NECK FINDINGS Aortic arch: Calcified and noncalcified plaque along the arch. Great vessel origins are patent. Circumferential plaque at the left common carotid origin causing less than 50% stenosis. Right carotid system: Patent. Minimal calcified plaque at the ICA origin without measurable stenosis. Left carotid system: Patent. Calcified and noncalcified plaque along the proximal internal carotid causing less than 50% stenosis. Vertebral arteries: Patent. Plaque at the right vertebral origin causes mild stenosis. There is focal severe stenosis of the left vertebral artery at the C3-C4 level. Otherwise mild multifocal irregularity related to cervical spine degenerative changes. Skeleton: Degenerative changes of the cervical spine Other neck: No mass or adenopathy. Upper chest: No apical lung mass. Review of the MIP images confirms the above findings CTA HEAD FINDINGS Anterior circulation: Intracranial internal carotid arteries are patent with mild calcified plaque. Anterior cerebral arteries are patent with anterior  communicating artery present. Tandem moderate to marked stenoses of the right A2 ACA. Irregularity of the left A3 ACA with focal marked stenosis. Middle cerebral arteries are patent. Moderate to marked stenosis of the distal left M1 MCA extending into M2 branch with diminished flow. Additional multifocal bilateral distal MCA stenoses. Posterior circulation: Intracranial vertebral arteries, basilar artery, and posterior cerebral arteries are patent. Atherosclerotic irregularity of both posterior cerebral arteries with multiple moderate and marked stenoses of the P2 segments Venous sinuses: As permitted by contrast timing, patent. Review of the MIP images confirms the above findings  IMPRESSION: No large vessel occlusion. Plaque at the proximal left ICA causing less than 50% stenosis. No measurable stenosis at the right ICA origin. Focal severe stenosis of the left V2 vertebral artery without apparent distal flow limitation. Significant intracranial atherosclerosis as detailed above involving anterior and posterior circulations. Preliminary results were communicated to Dr. Amada Jupiter at 11:31 amon 5/10/2021by text page via the [facility] messaging system. Electronically Signed   By: Guadlupe Spanish M.D.   On: 03/04/2020 11:53   CT HEAD CODE STROKE WO CONTRAST  Result Date: 03/04/2020 CLINICAL DATA:  Code stroke.  Dysarthria, facial droop EXAM: CT HEAD WITHOUT CONTRAST TECHNIQUE: Contiguous axial images were obtained from the base of the skull through the vertex without intravenous contrast. COMPARISON:  None. FINDINGS: Brain: There is no acute intracranial hemorrhage, mass effect, or edema. Gray-white differentiation is preserved. Prominence of the ventricles and sulci reflects generalized parenchymal volume loss. There is no extra-axial fluid collection. Patchy hypoattenuation in the supratentorial white matter is nonspecific but may reflect mild chronic microvascular ischemic changes. Vascular: No hyperdense vessel. There is intracranial atherosclerotic calcification at the skull base. Skull: Unremarkable Sinuses/Orbits: . minor mucosal thickening. Orbits are unremarkable. Other: Mastoid air cells are clear. ASPECTS (Alberta Stroke Program Early CT Score) - Ganglionic level infarction (caudate, lentiform nuclei, internal capsule, insula, M1-M3 cortex): 7 - Supraganglionic infarction (M4-M6 cortex): 3 Total score (0-10 with 10 being normal): 10 IMPRESSION: No acute intracranial hemorrhage or evidence of acute infarction. ASPECT score is 10. Chronic microvascular ischemic changes. These results were communicated to Dr. Amada Jupiter at 11:21 amon 5/10/2021by text page via the [facility] messaging  system. Electronically Signed   By: Guadlupe Spanish M.D.   On: 03/04/2020 11:23    EKG: Independently reviewed.  Sinus rhythm, RBBB  Assessment/Plan Active Problems:   TIA (transient ischemic attack)  TIA with dysarthria and probably ataxia -Symptoms resolved, telemetry observation, likely will need less than 2 midnight hospital stay -Agreed with aspirin and increased statin -Unable to perform MRI -Allow permissive hypertension -Telemetry monitor throughout person A. fib, echo  Hyponatremia -Likely from HCTZ, hold BP meds for now and allow permissive hypertension  HTN -As needed hydralazine for now for BP more than 200/110   CKD -IVF for today given that pt got IV contrast for CTA  HLD Escalated to atorvastatin  IDDM -Poorly controlled, will hold Metformin for now for patient for 72 hours, for he received IV contrast today -Lantus plus Sliding scale, A1C  Hypothyroid -On Synthroid  DVT prophylaxis: Heparin subcu Code Status: Full code Family Communication: None at bedside Disposition Plan: PT evaluation and other stroke work-up, probably discharge within 24 hours Consults called: Neurology Admission status: Telemetry obs   Emeline General MD [facility] Pager [phone]    03/04/2020, 12:54 PM

## 2020-03-04 NOTE — Consult Note (Addendum)
Neurology Consultation  Reason for Consult: Code stroke Referring Physician: Pollyann Savoy, MD  CC: Dizziness, slurred speech  History is obtained from: EMS/patient  HPI: Joe Peters is a 86 y.o. male with past medical history of diabetes, BPH, hypothyroidism, urinary retention, aortic aneurysm with endovascular stent graft, depression, hypercholesterolemia, dementia and possible TIAs in the past.  Patient was brought to [facility] as code stroke.  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  Initially he was walking well with his walker however suddenly and staff noted he was walking with instability and off balance.  They also noticed intermittent slurred speech which EMS also noted while in route.  Patient states that he did note that he felt dizzy but not vertiginous.  When asked about any past strokes, patient does not recall having any TIA or strokes.  Of note: When patient was in [place] he did have history of hematuria possibly due to aspirin but he is not sure.  ED course  Relevant labs include : -Sodium 128 -Chloride 92 -Glucose 252 -Creatinine 1.5  CT head shows-no acute intracranial hemorrhage or evidence of acute infarct.  LKW: 1000 tpa given?: no, too good to treat Premorbid modified Rankin scale (mRS): 0  Past medical history: -Diabetes -Hypothyroidism -Benign prostatic hypertrophy -Urinary retention -Elevated PSA -History of endovascular stent graft for abdominal aortic aneurysm -Major depression.  Family History  Problem Relation Age of Onset  . Hypertension Mother   . Hypertension Father    Social History:   has no history on file for tobacco, alcohol, and drug.  Medications -Sertraline -Fenofibrate -Insulin -Synthroid -Neurontin -Donepezil -Zocor -Metformin -Hyzaar -Cilostazol  ROS:   General ROS: negative for - chills, fatigue, fever, night sweats, weight gain or weight loss Psychological  ROS: negative for - behavioral disorder, hallucinations, memory difficulties, mood swings or suicidal ideation Ophthalmic ROS: negative for - blurry vision, double vision, eye pain or loss of vision ENT ROS: negative for - epistaxis, nasal discharge, oral lesions, sore throat, tinnitus or vertigo Allergy and Immunology ROS: negative for - hives or itchy/watery eyes Respiratory ROS: negative for - cough, hemoptysis, shortness of breath or wheezing Cardiovascular ROS: negative for - chest pain, dyspnea on exertion, edema or irregular heartbeat Gastrointestinal ROS: negative for - abdominal pain, diarrhea, hematemesis, nausea/vomiting or stool incontinence Genito-Urinary ROS: Positive for -retention and incontinence Musculoskeletal ROS: negative for - joint swelling or muscular weakness Neurological ROS: as noted in HPI Dermatological ROS: negative for rash and skin lesion changes  Exam: Current vital signs: BP 140/64   Pulse 72   Temp 97.6 F (36.4 C) (Axillary)   Resp 20   Wt 67.1 kg   SpO2 100%  Vital signs in last 24 hours: Temp:  [97.6 F (36.4 C)] 97.6 F (36.4 C) (05/10 1135) Pulse Rate:  [72] 72 (05/10 1135) Resp:  [20] 20 (05/10 1135) BP: (140)/(64) 140/64 (05/10 1133) SpO2:  [100 %] 100 % (05/10 1135) Weight:  [67.1 kg] 67.1 kg (05/10 1100)   Constitutional: Appears well-developed and well-nourished.  Psych: Affect appropriate to situation Eyes: No scleral injection HENT: No OP obstrucion Head: Normocephalic.  Cardiovascular: Normal rate and regular rhythm.  Respiratory: Effort normal, non-labored breathing GI: Soft.  No distension. There is no tenderness.  Skin: WDI  Neuro: Mental Status: Patient is awake, alert, oriented to his age but not the year however patient does have history of dementia. Speech-showed no dysarthria, aphasia, intact naming, repeating, comprehension. Cranial  Nerves: II: Visual Fields are full.  III,IV, VI: EOMI without ptosis or  diploplia.  Saccadic eye movements.  Pupils equal, round and reactive to light V: Facial sensation is symmetric to temperature VII: Slight right nasolabial fold decrease.  VIII: hearing is intact to voice X: Palat elevates symmetrically XI: Shoulder shrug is symmetric. XII: tongue is midline without atrophy or fasciculations.  Motor: Tone is normal. Bulk is normal. 5/5 strength was present in all four extremities.  No drift No aterixis Sensory: Sensation is symmetric to light touch and temperature in the arms and legs. DSS Deep Tendon Reflexes: 2+ and symmetric in the biceps and patellae.  Plantars: Toes are downgoing bilaterally.  Cerebellar: FNF and HKS are intact bilaterally  Labs I have reviewed labs in epic and the results pertinent to this consultation are:   CBC    Component Value Date/Time   WBC 7.1 03/04/2020 1110   RBC 4.14 (L) 03/04/2020 1110   HGB 12.9 (L) 03/04/2020 1112   HCT 38.0 (L) 03/04/2020 1112   PLT 208 03/04/2020 1110   MCV 94.2 03/04/2020 1110   MCH 31.2 03/04/2020 1110   MCHC 33.1 03/04/2020 1110   RDW 13.3 03/04/2020 1110   LYMPHSABS 1.7 03/04/2020 1110   MONOABS 0.5 03/04/2020 1110   EOSABS 0.1 03/04/2020 1110   BASOSABS 0.1 03/04/2020 1110    CMP     Component Value Date/Time   NA 128 (L) 03/04/2020 1112   K 4.1 03/04/2020 1112   CL 92 (L) 03/04/2020 1112   GLUCOSE 252 (H) 03/04/2020 1112   BUN 20 03/04/2020 1112   CREATININE 1.50 (H) 03/04/2020 1112    Lipid Panel  No results found for: CHOL, TRIG, HDL, CHOLHDL, VLDL, LDLCALC, LDLDIRECT   Imaging I have reviewed the images obtained:  CT head shows-no acute intracranial hemorrhage or evidence of acute infarct.  MRI examination of the brain-patient states that back in [place] he did have a rod placed in his leg.  Unclear if this would be compatible with MRI.  Etta Quill PA-C [facility] [phone]  M-F  (9:00 am- 5:00 PM)  03/04/2020, 11:47 AM   I have seen  the patient and reviewed the note.  He had a slight right facial weakness and a waxing/waning dysarthria during the examination.  Assessment: This is an 86 year old male presenting to the hospital as code stroke for symptoms of dizziness and intermittent slurred speech.  At time of arrival patient symptoms had mostly resolved.  On exam he does have a slight right nasolabial fold decrease otherwise negatve.  Given patient's age and stroke risk factors we will recommend admission for further TIA/stroke work-up.  Impression: -Dizziness -Transient facial droop/dysarthria  Recommend -MRI of the brain without contrast-if possible as patient does have metal placed in his leg back in [place]. -Transthoracic Echo -Start patient on ASA 81 mg daily  -Start or continue Atorvastatin 80 mg/other high intensity statin -BP goal: permissive HTN upto 220/120 mmHg -HBAIC and Lipid profile -Telemetry monitoring -Frequent neuro checks -NPO until passes stroke swallow screen -PT/OT # please page stroke NP  Or  PA  Or MD from 8am -4 pm  as this patient from this time will be  followed by the stroke.   You can look them up on [email or web address]  Password [email or web address]  Roland Rack, MD [facility] [phone]  If 7pm- 7am, please page neurology on call as listed in [place].

## 2020-03-04 NOTE — Progress Notes (Signed)
  Echocardiogram 2D Echocardiogram has been performed.  Joe Peters 03/04/2020, 2:42 PM

## 2020-03-05 ENCOUNTER — Inpatient Hospital Stay (HOSPITAL_COMMUNITY): Payer: Medicare Other

## 2020-03-05 ENCOUNTER — Observation Stay (HOSPITAL_COMMUNITY): Payer: Medicare Other

## 2020-03-05 DIAGNOSIS — R42 Dizziness and giddiness: Secondary | ICD-10-CM | POA: Diagnosis not present

## 2020-03-05 DIAGNOSIS — R338 Other retention of urine: Secondary | ICD-10-CM | POA: Diagnosis present

## 2020-03-05 DIAGNOSIS — Z882 Allergy status to sulfonamides status: Secondary | ICD-10-CM | POA: Diagnosis not present

## 2020-03-05 DIAGNOSIS — Z8679 Personal history of other diseases of the circulatory system: Secondary | ICD-10-CM | POA: Diagnosis not present

## 2020-03-05 DIAGNOSIS — E1365 Other specified diabetes mellitus with hyperglycemia: Secondary | ICD-10-CM | POA: Diagnosis not present

## 2020-03-05 DIAGNOSIS — R471 Dysarthria and anarthria: Secondary | ICD-10-CM | POA: Diagnosis present

## 2020-03-05 DIAGNOSIS — E1151 Type 2 diabetes mellitus with diabetic peripheral angiopathy without gangrene: Secondary | ICD-10-CM | POA: Diagnosis present

## 2020-03-05 DIAGNOSIS — Z79899 Other long term (current) drug therapy: Secondary | ICD-10-CM | POA: Diagnosis not present

## 2020-03-05 DIAGNOSIS — R4781 Slurred speech: Secondary | ICD-10-CM | POA: Diagnosis present

## 2020-03-05 DIAGNOSIS — Z7982 Long term (current) use of aspirin: Secondary | ICD-10-CM | POA: Diagnosis not present

## 2020-03-05 DIAGNOSIS — E1349 Other specified diabetes mellitus with other diabetic neurological complication: Secondary | ICD-10-CM

## 2020-03-05 DIAGNOSIS — N1831 Chronic kidney disease, stage 3a: Secondary | ICD-10-CM | POA: Diagnosis present

## 2020-03-05 DIAGNOSIS — R55 Syncope and collapse: Secondary | ICD-10-CM | POA: Diagnosis not present

## 2020-03-05 DIAGNOSIS — Z8249 Family history of ischemic heart disease and other diseases of the circulatory system: Secondary | ICD-10-CM | POA: Diagnosis not present

## 2020-03-05 DIAGNOSIS — E785 Hyperlipidemia, unspecified: Secondary | ICD-10-CM | POA: Diagnosis present

## 2020-03-05 DIAGNOSIS — Z79890 Hormone replacement therapy (postmenopausal): Secondary | ICD-10-CM | POA: Diagnosis not present

## 2020-03-05 DIAGNOSIS — E1122 Type 2 diabetes mellitus with diabetic chronic kidney disease: Secondary | ICD-10-CM | POA: Diagnosis present

## 2020-03-05 DIAGNOSIS — Z880 Allergy status to penicillin: Secondary | ICD-10-CM | POA: Diagnosis not present

## 2020-03-05 DIAGNOSIS — N179 Acute kidney failure, unspecified: Secondary | ICD-10-CM | POA: Diagnosis present

## 2020-03-05 DIAGNOSIS — E039 Hypothyroidism, unspecified: Secondary | ICD-10-CM | POA: Diagnosis present

## 2020-03-05 DIAGNOSIS — G459 Transient cerebral ischemic attack, unspecified: Secondary | ICD-10-CM | POA: Diagnosis present

## 2020-03-05 DIAGNOSIS — I129 Hypertensive chronic kidney disease with stage 1 through stage 4 chronic kidney disease, or unspecified chronic kidney disease: Secondary | ICD-10-CM | POA: Diagnosis present

## 2020-03-05 DIAGNOSIS — M2669 Other specified disorders of temporomandibular joint: Secondary | ICD-10-CM | POA: Diagnosis not present

## 2020-03-05 DIAGNOSIS — E1142 Type 2 diabetes mellitus with diabetic polyneuropathy: Secondary | ICD-10-CM | POA: Diagnosis present

## 2020-03-05 DIAGNOSIS — Z794 Long term (current) use of insulin: Secondary | ICD-10-CM | POA: Diagnosis not present

## 2020-03-05 DIAGNOSIS — G9340 Encephalopathy, unspecified: Secondary | ICD-10-CM | POA: Diagnosis present

## 2020-03-05 DIAGNOSIS — I672 Cerebral atherosclerosis: Secondary | ICD-10-CM | POA: Diagnosis present

## 2020-03-05 DIAGNOSIS — N401 Enlarged prostate with lower urinary tract symptoms: Secondary | ICD-10-CM | POA: Diagnosis present

## 2020-03-05 DIAGNOSIS — E871 Hypo-osmolality and hyponatremia: Secondary | ICD-10-CM | POA: Diagnosis present

## 2020-03-05 DIAGNOSIS — Z20822 Contact with and (suspected) exposure to covid-19: Secondary | ICD-10-CM | POA: Diagnosis present

## 2020-03-05 LAB — BASIC METABOLIC PANEL
Anion gap: 6 (ref 5–15)
BUN: 16 mg/dL (ref 8–23)
CO2: 26 mmol/L (ref 22–32)
Calcium: 8.6 mg/dL — ABNORMAL LOW (ref 8.9–10.3)
Chloride: 100 mmol/L (ref 98–111)
Creatinine, Ser: 1.35 mg/dL — ABNORMAL HIGH (ref 0.61–1.24)
GFR calc Af Amer: 54 mL/min — ABNORMAL LOW (ref >60)
GFR calc non Af Amer: 46 mL/min — ABNORMAL LOW (ref >60)
Glucose, Bld: 100 mg/dL — ABNORMAL HIGH (ref 70–99)
Potassium: 3.7 mmol/L (ref 3.5–5.1)
Sodium: 132 mmol/L — ABNORMAL LOW (ref 135–145)

## 2020-03-05 LAB — GLUCOSE, CAPILLARY
Glucose-Capillary: 75 mg/dL (ref 70–99)
Glucose-Capillary: 160 mg/dL — ABNORMAL HIGH (ref 70–99)
Glucose-Capillary: 159 mg/dL — ABNORMAL HIGH (ref 70–99)
Glucose-Capillary: 156 mg/dL — ABNORMAL HIGH (ref 70–99)

## 2020-03-05 LAB — HEMOGLOBIN A1C
Hgb A1c MFr Bld: 7.5 % — ABNORMAL HIGH (ref 4.8–5.6)
Mean Plasma Glucose: 168.55 mg/dL

## 2020-03-05 LAB — LIPID PANEL
Cholesterol: 107 mg/dL (ref 0–200)
HDL: 50 mg/dL (ref >40)
LDL Cholesterol: 48 mg/dL (ref 0–99)
Total CHOL/HDL Ratio: 2.1 RATIO
Triglycerides: 47 mg/dL (ref <150)
VLDL: 9 mg/dL (ref 0–40)

## 2020-03-05 MED ORDER — SIMVASTATIN 20 MG PO TABS
40.0000 mg | ORAL_TABLET | Freq: Every day | ORAL | Status: DC
  Administered 2020-03-05 – 2020-03-09 (×5): 40 mg via ORAL
  Filled 2020-03-05 (×5): qty 2

## 2020-03-05 MED ORDER — INSULIN GLARGINE 100 UNIT/ML ~~LOC~~ SOLN
5.0000 [IU] | Freq: Every day | SUBCUTANEOUS | Status: DC
  Administered 2020-03-05 – 2020-03-09 (×5): 5 [IU] via SUBCUTANEOUS
  Filled 2020-03-05 (×6): qty 0.05

## 2020-03-05 NOTE — Progress Notes (Signed)
STROKE TEAM PROGRESS NOTE   INTERVAL HISTORY He is up at the bedside. He fell at home and was unable to get up by himself. He did not loose consciousness.  He denies focal stroke symptoms except dizziness and fall.  He denies slurred speech which is noted by EMS.  I personally reviewed history of presenting illness, imaging films in PACS and electronic medical records.  He has a piece of metal in his right leg during the [place] War and we are not sure if it is MRI compatible  Vitals:   03/04/20 2200 03/05/20 0000 03/05/20 0412 03/05/20 0811  BP: 115/60 129/65 100/66 107/60  Pulse: 76 84 73 69  Resp: 16 16 16 18   Temp: (!) 97.5 F (36.4 C) 98.5 F (36.9 C) (!) 97.3 F (36.3 C) 97.9 F (36.6 C)  TempSrc: Oral Oral Oral Oral  SpO2: 99% 98% 97% 100%  Weight:      Height:        CBC:  Recent Labs  Lab 03/04/20 1110 03/04/20 1112  WBC 7.1  --   NEUTROABS 4.7  --   HGB 12.9* 12.9*  HCT 39.0 38.0*  MCV 94.2  --   PLT 208  --     Basic Metabolic Panel:  Recent Labs  Lab 03/04/20 1110 03/04/20 1110 03/04/20 1112 03/05/20 0253  NA 130*   < > 128* 132*  K 4.2   < > 4.1 3.7  CL 94*   < > 92* 100  CO2 23  --   --  26  GLUCOSE 259*   < > 252* 100*  BUN 18   < > 20 16  CREATININE 1.52*   < > 1.50* 1.35*  CALCIUM 9.5  --   --  8.6*   < > = values in this interval not displayed.   Lipid Panel:     Component Value Date/Time   CHOL 107 03/05/2020 0253   TRIG 47 03/05/2020 0253   HDL 50 03/05/2020 0253   CHOLHDL 2.1 03/05/2020 0253   VLDL 9 03/05/2020 0253   LDLCALC 48 03/05/2020 0253   HgbA1c:  Lab Results  Component Value Date   HGBA1C 7.5 (H) 03/05/2020    Alcohol Level     Component Value Date/Time   ETH <10 03/04/2020 1110    IMAGING past 24 hours CT ANGIO HEAD W OR WO CONTRAST  Result Date: 03/04/2020 CLINICAL DATA:  Code stroke follow-up EXAM: CT ANGIOGRAPHY HEAD AND NECK TECHNIQUE: Multidetector CT imaging of the head and neck was performed using the  standard protocol during bolus administration of intravenous contrast. Multiplanar CT image reconstructions and MIPs were obtained to evaluate the vascular anatomy. Carotid stenosis measurements (when applicable) are obtained utilizing NASCET criteria, using the distal internal carotid diameter as the denominator. CONTRAST:  62mL OMNIPAQUE IOHEXOL 350 MG/ML SOLN COMPARISON:  None. FINDINGS: CTA NECK FINDINGS Aortic arch: Calcified and noncalcified plaque along the arch. Great vessel origins are patent. Circumferential plaque at the left common carotid origin causing less than 50% stenosis. Right carotid system: Patent. Minimal calcified plaque at the ICA origin without measurable stenosis. Left carotid system: Patent. Calcified and noncalcified plaque along the proximal internal carotid causing less than 50% stenosis. Vertebral arteries: Patent. Plaque at the right vertebral origin causes mild stenosis. There is focal severe stenosis of the left vertebral artery at the C3-C4 level. Otherwise mild multifocal irregularity related to cervical spine degenerative changes. Skeleton: Degenerative changes of the cervical spine Other neck: No  mass or adenopathy. Upper chest: No apical lung mass. Review of the MIP images confirms the above findings CTA HEAD FINDINGS Anterior circulation: Intracranial internal carotid arteries are patent with mild calcified plaque. Anterior cerebral arteries are patent with anterior communicating artery present. Tandem moderate to marked stenoses of the right A2 ACA. Irregularity of the left A3 ACA with focal marked stenosis. Middle cerebral arteries are patent. Moderate to marked stenosis of the distal left M1 MCA extending into M2 branch with diminished flow. Additional multifocal bilateral distal MCA stenoses. Posterior circulation: Intracranial vertebral arteries, basilar artery, and posterior cerebral arteries are patent. Atherosclerotic irregularity of both posterior cerebral arteries  with multiple moderate and marked stenoses of the P2 segments Venous sinuses: As permitted by contrast timing, patent. Review of the MIP images confirms the above findings IMPRESSION: No large vessel occlusion. Plaque at the proximal left ICA causing less than 50% stenosis. No measurable stenosis at the right ICA origin. Focal severe stenosis of the left V2 vertebral artery without apparent distal flow limitation. Significant intracranial atherosclerosis as detailed above involving anterior and posterior circulations. Preliminary results were communicated to Dr. Amada Jupiter at 11:31 amon 5/10/2021by text page via the [facility] messaging system. Electronically Signed   By: Guadlupe Spanish M.D.   On: 03/04/2020 11:53   DG Chest 2 View  Result Date: 03/04/2020 CLINICAL DATA:  Slurred speech, dizziness EXAM: CHEST - 2 VIEW COMPARISON:  None. FINDINGS: Heart size is normal. Atherosclerotic calcification of the aortic knob. Prior descending aortic stent graft. There is a well-circumscribed 6.4 cm ovoid density adjacent to the mid descending thoracic aorta, which could potentially reflect an excluded aneurysm sac given the presence of the adjacent stent graft. No focal airspace consolidation, pleural effusion, or pneumothorax. No acute osseous findings. IMPRESSION: Smoothly marginated 6.4 cm ovoid density adjacent to the mid descending thoracic aorta, which could potentially reflect an excluded aneurysm sac given the presence of the adjacent stent graft. Further evaluation with contrast-enhanced chest CT is recommended if there are no available outside prior studies. Electronically Signed   By: Duanne Guess D.O.   On: 03/04/2020 13:50   CT ANGIO NECK W OR WO CONTRAST  Result Date: 03/04/2020 CLINICAL DATA:  Code stroke follow-up EXAM: CT ANGIOGRAPHY HEAD AND NECK TECHNIQUE: Multidetector CT imaging of the head and neck was performed using the standard protocol during bolus administration of intravenous contrast.  Multiplanar CT image reconstructions and MIPs were obtained to evaluate the vascular anatomy. Carotid stenosis measurements (when applicable) are obtained utilizing NASCET criteria, using the distal internal carotid diameter as the denominator. CONTRAST:  50mL OMNIPAQUE IOHEXOL 350 MG/ML SOLN COMPARISON:  None. FINDINGS: CTA NECK FINDINGS Aortic arch: Calcified and noncalcified plaque along the arch. Great vessel origins are patent. Circumferential plaque at the left common carotid origin causing less than 50% stenosis. Right carotid system: Patent. Minimal calcified plaque at the ICA origin without measurable stenosis. Left carotid system: Patent. Calcified and noncalcified plaque along the proximal internal carotid causing less than 50% stenosis. Vertebral arteries: Patent. Plaque at the right vertebral origin causes mild stenosis. There is focal severe stenosis of the left vertebral artery at the C3-C4 level. Otherwise mild multifocal irregularity related to cervical spine degenerative changes. Skeleton: Degenerative changes of the cervical spine Other neck: No mass or adenopathy. Upper chest: No apical lung mass. Review of the MIP images confirms the above findings CTA HEAD FINDINGS Anterior circulation: Intracranial internal carotid arteries are patent with mild calcified plaque. Anterior cerebral arteries are patent  with anterior communicating artery present. Tandem moderate to marked stenoses of the right A2 ACA. Irregularity of the left A3 ACA with focal marked stenosis. Middle cerebral arteries are patent. Moderate to marked stenosis of the distal left M1 MCA extending into M2 branch with diminished flow. Additional multifocal bilateral distal MCA stenoses. Posterior circulation: Intracranial vertebral arteries, basilar artery, and posterior cerebral arteries are patent. Atherosclerotic irregularity of both posterior cerebral arteries with multiple moderate and marked stenoses of the P2 segments Venous  sinuses: As permitted by contrast timing, patent. Review of the MIP images confirms the above findings IMPRESSION: No large vessel occlusion. Plaque at the proximal left ICA causing less than 50% stenosis. No measurable stenosis at the right ICA origin. Focal severe stenosis of the left V2 vertebral artery without apparent distal flow limitation. Significant intracranial atherosclerosis as detailed above involving anterior and posterior circulations. Preliminary results were communicated to Dr. Leonel Ramsay at 11:31 amon 5/10/2021by text page via the [facility] messaging system. Electronically Signed   By: Macy Mis M.D.   On: 03/04/2020 11:53   ECHOCARDIOGRAM COMPLETE  Result Date: 03/04/2020    ECHOCARDIOGRAM REPORT   Patient Name:   TRYGG MANTZ Date of Exam: 03/04/2020 Medical Rec #:  [id number]        Height:       70.0 in Accession #:    [id number]       Weight:       147.9 lb Date of Birth:  09/08/1928        BSA:          1.836 m Patient Age:    14 years         BP:           128/64 mmHg Patient Gender: M                HR:           70 bpm. Exam Location:  Inpatient Procedure: 2D Echo, Cardiac Doppler and Color Doppler Indications:    TIA 435.96 / G45.9  History:        Patient has no prior history of Echocardiogram examinations.  Sonographer:    Jonelle Sidle Dance Referring Phys: [id number] [place]  1. Left ventricular ejection fraction, by estimation, is 55 to 60%. The left ventricle has normal function. The left ventricle has no regional wall motion abnormalities. There is mild left ventricular hypertrophy. Left ventricular diastolic parameters are indeterminate.  2. Right ventricular systolic function is normal. The right ventricular size is normal. There is normal pulmonary artery systolic pressure. The estimated right ventricular systolic pressure is 17.4 mmHg.  3. The mitral valve is normal in structure. Trivial mitral valve regurgitation.  4. The tricuspid valve is abnormal. Thickened  leaflets. Mild regurgitation.  5. The aortic valve is tricuspid. Aortic valve regurgitation is mild to moderate. No aortic stenosis is present.  6. The inferior vena cava is normal in size with greater than 50% respiratory variability, suggesting right atrial pressure of 3 mmHg.  7. Aortic dilatation noted. Aneurysm of the aortic root, measuring 47 mm. FINDINGS  Left Ventricle: Left ventricular ejection fraction, by estimation, is 55 to 60%. The left ventricle has normal function. The left ventricle has no regional wall motion abnormalities. The left ventricular internal cavity size was normal in size. There is  mild left ventricular hypertrophy. Left ventricular diastolic parameters are indeterminate. Right Ventricle: The right ventricular size is normal. Right vetricular wall thickness was not assessed. Right  ventricular systolic function is normal. There is normal pulmonary artery systolic pressure. The tricuspid regurgitant velocity is 2.34 m/s, and with an assumed right atrial pressure of 3 mmHg, the estimated right ventricular systolic pressure is 24.9 mmHg. Left Atrium: Left atrial size was normal in size. Right Atrium: Right atrial size was normal in size. Pericardium: Trivial pericardial effusion is present. Mitral Valve: The mitral valve is normal in structure. Trivial mitral valve regurgitation. Tricuspid Valve: Thickened leaflets. The tricuspid valve is abnormal. Tricuspid valve regurgitation is mild. Aortic Valve: The aortic valve is tricuspid. Aortic valve regurgitation is mild to moderate. Aortic regurgitation PHT measures 473 msec. No aortic stenosis is present. Pulmonic Valve: The pulmonic valve was not well visualized. Pulmonic valve regurgitation is mild. Aorta: Aortic dilatation noted. There is an aneurysm involving the aortic root. The aneurysm measures 47 mm. Venous: The inferior vena cava is normal in size with greater than 50% respiratory variability, suggesting right atrial pressure of 3  mmHg. IAS/Shunts: The interatrial septum was not well visualized.  LEFT VENTRICLE PLAX 2D LVIDd:         4.20 cm LVIDs:         3.40 cm LV PW:         0.90 cm LV IVS:        1.00 cm LVOT diam:     2.10 cm LV SV:         57 LV SV Index:   31 LVOT Area:     3.46 cm  RIGHT VENTRICLE             IVC RV Basal diam:  3.40 cm     IVC diam: 1.70 cm RV Mid diam:    2.10 cm RV S prime:     10.40 cm/s TAPSE (M-mode): 2.0 cm LEFT ATRIUM             Index       RIGHT ATRIUM           Index LA diam:        2.40 cm 1.31 cm/m  RA Area:     18.40 cm LA Vol (A2C):   39.5 ml 21.51 ml/m RA Volume:   48.90 ml  26.63 ml/m LA Vol (A4C):   50.5 ml 27.50 ml/m LA Biplane Vol: 44.2 ml 24.07 ml/m  AORTIC VALVE LVOT Vmax:   93.60 cm/s LVOT Vmean:  57.350 cm/s LVOT VTI:    0.164 m AI PHT:      473 msec  AORTA Ao Root diam: 4.70 cm Ao Asc diam:  3.40 cm MITRAL VALVE               TRICUSPID VALVE MV Area (PHT): 2.62 cm    TR Peak grad:   21.9 mmHg MV Decel Time: 289 msec    TR Vmax:        234.00 cm/s MV E velocity: 0.51 cm/s MV A velocity: 72.30 cm/s  SHUNTS MV E/A ratio:  0.01        Systemic VTI:  0.16 m                            Systemic Diam: 2.10 cm Epifanio Lesches MD Electronically signed by Epifanio Lesches MD Signature Date/Time: 03/04/2020/9:18:28 PM    Final    CT HEAD CODE STROKE WO CONTRAST  Result Date: 03/04/2020 CLINICAL DATA:  Code stroke.  Dysarthria, facial droop EXAM: CT HEAD WITHOUT CONTRAST TECHNIQUE:  Contiguous axial images were obtained from the base of the skull through the vertex without intravenous contrast. COMPARISON:  None. FINDINGS: Brain: There is no acute intracranial hemorrhage, mass effect, or edema. Gray-white differentiation is preserved. Prominence of the ventricles and sulci reflects generalized parenchymal volume loss. There is no extra-axial fluid collection. Patchy hypoattenuation in the supratentorial white matter is nonspecific but may reflect mild chronic microvascular ischemic  changes. Vascular: No hyperdense vessel. There is intracranial atherosclerotic calcification at the skull base. Skull: Unremarkable Sinuses/Orbits: . minor mucosal thickening. Orbits are unremarkable. Other: Mastoid air cells are clear. ASPECTS ([place] Stroke Program Early CT Score) - Ganglionic level infarction (caudate, lentiform nuclei, internal capsule, insula, M1-M3 cortex): 7 - Supraganglionic infarction (M4-M6 cortex): 3 Total score (0-10 with 10 being normal): 10 IMPRESSION: No acute intracranial hemorrhage or evidence of acute infarction. ASPECT score is 10. Chronic microvascular ischemic changes. These results were communicated to Dr. Amada JupiterKirkpatrick at 11:21 amon 5/10/2021by text page via the [facility] messaging system. Electronically Signed   By: Guadlupe SpanishPraneil  Patel M.D.   On: 03/04/2020 11:23    PHYSICAL EXAM Pleasant elderly Caucasian male not in distress.  He is has decreased hearing bilaterally. . Afebrile. Head is nontraumatic. Neck is supple without bruit.    Cardiac exam no murmur or gallop. Lungs are clear to auscultation. Distal pulses are well felt. Neurological Exam ;  Awake  Alert oriented x 3. Normal speech and language.  Diminished attention, registration and recall..Eye movements full without nystagmus.fundi were not visualized. Vision acuity and fields appear normal. Hearing is normal. Palatal movements are normal. Face symmetric. Tongue midline. Normal strength, tone, reflexes and coordination. Normal sensation. Gait deferred.  ASSESSMENT/PLAN Mr. Joe Peters is a 86 y.o. male with history of diabetes, BPH, hypothyroidism, urinary retention, aortic aneurysm with endovascular stent graft, depression, hypercholesterolemia, dementia and possible TIAs in the past presenting with dizziness, gait instability and slurred speech.   Possible TIA vs Syncope  CT head No acute abnormality. Small vessel disease. Atrophy. ASPECTS 10.     CTA head & neck no LVO. Proximal L ICA < 50% stenosis.  L V2 focal severe stenosis. Significant atherosclerosis throughout.   Repeat CT at 24h no change  2D Echo EF 55-60%. No source of embolus   EEG  pending  LDL 48  HgbA1c 7.5  Heparin 5000 units sq tid for VTE prophylaxis  pletal prior to admission, now on aspirin 81 mg daily and pletal. No indication for additional aspirin.  Will d/c. Continue pletal at d/c.   Therapy recommendations:  SNF (currently at ALF level, may be able to provide appropriate care)  Disposition:  pending   Hypertension  Stable . BP goal normotensive  Hyperlipidemia  Home meds:  zocor 40  Now on lipitor 80  LDL 48, at goal < 70  Ok to continue home statin, continue at discharge  Diabetes type II Uncontrolled  HgbA1c 7.5, goal < 7.0  Other Stroke Risk Factors  Advanced age  Hx AAA s/p stent graft on pletal  Hx TIA per hx, no documentation in EPIC  Other Active Problems  CKD  hypothyroid on synthroid   Hospital day # 0  Patient presented with episodes of dizziness and gait ataxia and 2 falls without definite loss of consciousness.  He denies focal neurological symptoms though EMS describes some slurred speech.  Possibilities include vertebrobasilar TIAs versus syncope doubt seizures.  Patient cannot have an MRI as he has a piece of metal in his right leg from  the [place] War which may not be MRI compatible.  Recommend repeat CT scan of the head without contrast today and continue ongoing stroke work-up.  Recommend aspirin 81 mg daily and Pletal and aggressive risk factor modification.  No family available for discussion.  Discussed with Dr. Margo Aye.  Greater than 50% time during the 35-minute visit was spent on counseling and coordination of care about his dizziness and possible TIA episode and answering Delia Heady, MD Medical Director [facility] Pager: [phone] 03/05/2020 2:36 PMTo contact Stroke Continuity provider, please refer to [email or web address]. After hours, contact General  Neurology

## 2020-03-05 NOTE — Procedures (Signed)
Patient Name: Joe Peters  MRN: [id number]  Epilepsy Attending: Charlsie Quest  Referring Physician/Provider: Dr Dow Adolph Date: 03/05/2020 Duration: 23.38 mins  Patient history: 86yo M who presented with dizziness, gait instability and slurred speech. EEG to evaluate for seizure  Level of alertness: awake, drowsy  AEDs during EEG study: Gabapentin  Technical aspects: This EEG study was done with scalp electrodes positioned according to the 10-20 International system of electrode placement. Electrical activity was acquired at a sampling rate of 500Hz  and reviewed with a high frequency filter of 70Hz  and a low frequency filter of 1Hz . EEG data were recorded continuously and digitally stored.   DESCRIPTION:: The posterior dominant rhythm consists of 8 Hz activity of moderate voltage (25-35 uV) seen predominantly in posterior head regions, symmetric and reactive to eye opening and eye closing.        Drowsiness was characterized by attenuation of the posterior background rhythm. Intermittent left temporal 2-3Hz  delta slowing was also noted.  Hyperventilation and photic stimulation were not performed.  ABNORMALITY - Intermittent slow, left temporal region  IMPRESSION: This study is suggestive of non specific left temporal dysfunction. No seizures or epileptiform discharges were seen throughout the recording.  Deni Berti 

## 2020-03-05 NOTE — Progress Notes (Signed)
PROGRESS NOTE  Joe Peters EAV:[id number] DOB: [birth year] DOA: 03/04/2020 PCP: Martha Clan, MD  HPI/Recap of past 24 hours:  Joe Peters is a 86 y.o. male with medical history significant of HTN, IDDM, PVD, diabetic neuropathy, AAA rupture status post repair, HLD, hypothyroidism, brought in for code stroke.  Patient was at assisted living facility, has chronic ambulation dysfunction using a walker intermittently.  This morning while during a regular physical therapy, staff noticed patient suddenly stopped walking and became very wobbly and about to fall, meantime seemed was unable to verbalize for few minutes. They were able to hold him and no fall happened. Patient remembered as he felt like he was about to fall, and he wanted to ask for help but could not talk despite normal understanding and forming idea his mind. EMS on route noticed patient has intermittent slurred speech.  Patient denies any lightheaded or blurred vision, no chest pain or palpitations during this episode.  He was recently moved from [place] to join her daughter who works at his current staying facility as a Engineer, civil (consulting). ED Course: Patient is a [place] [employer] veteran, has broken bone and repaired with some unknown metals in his legs in 1950s. MRI unable to perform. CTA: No large vessel occlusion. Focal severe stenosis of the left V2 vertebral artery without apparent distal flow limitation.  Significant intracranial atherosclerosisCT head:No acute intracranial hemorrhage or evidence of acute infarction.  03/05/20: Patient seen and examined at bedside.  He has no new complaints this morning however when working with PT OT became minimally responsive with eyes flickering, lasting about 15 seconds.  EEG ordered to assess for possible seizure.  Neurology will see in consultation.  Assessment/Plan: Active Problems:   TIA (transient ischemic attack)   HTN (hypertension)   DM (diabetes mellitus), secondary, uncontrolled, with  neurologic complications (HCC)   TIA with dysarthria and probably ataxia -Unable to perform MRI -No large vessel occlusion on CTA head and neck. -Focal severe stenosis of the left V2 vertebral artery without apparent distal flow limitation. -Dysarthria-resolved this morning. -when working with PT OT became minimally responsive with eyes flickering, lasting about 15 seconds.  EEG ordered to assess for possible seizure.  Neurology will see in consultation. -Allowing permissive hypertension -Continue to monitor on telemetry.  Severe intracranial atherosclerosis Findings as stated above Continue aspirin, Lipitor, Pletal  Hypovolemic hyponatremia -Likely from HCTZ, hold BP meds for now and allow permissive hypertension -Continue normal saline at 75 cc/h -Serum Na+ trending up -Repeat BMP AM  HTN -Allow for permissive HTN -As needed hydralazine for now for BP more than 220/120   CKD IIIA Baseline cr 1.3 GFR 46 -Continue IVF   HLD C/w High intensity atorvastatin  IDDM2 -a1C 7.5 on 03/05/20 -Hold Metformin -c/w Lantus plus Sliding scale, reduce lantus dose to 5U qhs to avoid hypoglycemia  Hypothyroid -On Synthroid  DVT prophylaxis: Heparin subcu TID Code Status: Full code Family Communication: None at bedside  Consults called: Neurology    Dispo: The patient is from:   ALF                       Anticipated d/c is to: ALF              Anticipated d/c date is: 03/06/20              Patient currently undergoing work up for neurological changes.        Objective: Vitals:   03/04/20 2200 03/05/20 0000  03/05/20 0412 03/05/20 0811  BP: 115/60 129/65 100/66 107/60  Pulse: 76 84 73 69  Resp: 16 16 16 18   Temp: (!) 97.5 F (36.4 C) 98.5 F (36.9 C) (!) 97.3 F (36.3 C) 97.9 F (36.6 C)  TempSrc: Oral Oral Oral Oral  SpO2: 99% 98% 97% 100%  Weight:      Height:        Intake/Output Summary (Last 24 hours) at 03/05/2020 0950 Last data filed at 03/05/2020  05/05/2020 Gross per 24 hour  Intake 612.19 ml  Output 1033 ml  Net -420.81 ml   Filed Weights   03/04/20 1100  Weight: 67.1 kg    Exam:  . General: 86 y.o. year-old male well developed well nourished in no acute distress.  Alert and oriented x3. . Cardiovascular: Regular rate and rhythm with no rubs or gallops.  No thyromegaly or JVD noted.   92 Respiratory: Clear to auscultation with no wheezes or rales. Good inspiratory effort. . Abdomen: Soft nontender nondistended with normal bowel sounds x4 quadrants. . Musculoskeletal: No lower extremity edema. 2/4 pulses in all 4 extremities. Marland Kitchen Psychiatry: Mood is appropriate for condition and setting   Data Reviewed: CBC: Recent Labs  Lab 03/04/20 1110 03/04/20 1112  WBC 7.1  --   NEUTROABS 4.7  --   HGB 12.9* 12.9*  HCT 39.0 38.0*  MCV 94.2  --   PLT 208  --    Basic Metabolic Panel: Recent Labs  Lab 03/04/20 1110 03/04/20 1112 03/05/20 0253  NA 130* 128* 132*  K 4.2 4.1 3.7  CL 94* 92* 100  CO2 23  --  26  GLUCOSE 259* 252* 100*  BUN 18 20 16   CREATININE 1.52* 1.50* 1.35*  CALCIUM 9.5  --  8.6*   GFR: Estimated Creatinine Clearance: 35.2 mL/min (A) (by C-G formula based on SCr of 1.35 mg/dL (H)). Liver Function Tests: Recent Labs  Lab 03/04/20 1110  AST 17  ALT 13  ALKPHOS 36*  BILITOT 1.3*  PROT 5.9*  ALBUMIN 3.4*   No results for input(s): LIPASE, AMYLASE in the last 168 hours. No results for input(s): AMMONIA in the last 168 hours. Coagulation Profile: Recent Labs  Lab 03/04/20 1110  INR 1.1   Cardiac Enzymes: No results for input(s): CKTOTAL, CKMB, CKMBINDEX, TROPONINI in the last 168 hours. BNP (last 3 results) No results for input(s): PROBNP in the last 8760 hours. HbA1C: Recent Labs    03/05/20 0253  HGBA1C 7.5*   CBG: Recent Labs  Lab 03/04/20 1701 03/04/20 2112 03/05/20 0602  GLUCAP 169* 151* 75   Lipid Profile: Recent Labs    03/05/20 0253  CHOL 107  HDL 50  LDLCALC 48  TRIG  47  CHOLHDL 2.1   Thyroid Function Tests: No results for input(s): TSH, T4TOTAL, FREET4, T3FREE, THYROIDAB in the last 72 hours. Anemia Panel: No results for input(s): VITAMINB12, FOLATE, FERRITIN, TIBC, IRON, RETICCTPCT in the last 72 hours. Urine analysis: No results found for: COLORURINE, APPEARANCEUR, LABSPEC, PHURINE, GLUCOSEU, HGBUR, BILIRUBINUR, KETONESUR, PROTEINUR, UROBILINOGEN, NITRITE, LEUKOCYTESUR Sepsis Labs: @LABRCNTIP (procalcitonin:4,lacticidven:4)  ) Recent Results (from the past 240 hour(s))  SARS Coronavirus 2 by RT PCR (hospital order, performed in [facility] hospital lab) Nasopharyngeal Nasopharyngeal Swab     Status: None   Collection Time: 03/04/20  4:00 PM   Specimen: Nasopharyngeal Swab  Result Value Ref Range Status   SARS Coronavirus 2 NEGATIVE NEGATIVE Final    Comment: (NOTE) SARS-CoV-2 target nucleic acids are NOT DETECTED.  The SARS-CoV-2 RNA is generally detectable in upper and lower respiratory specimens during the acute phase of infection. The lowest concentration of SARS-CoV-2 viral copies this assay can detect is 250 copies / mL. A negative result does not preclude SARS-CoV-2 infection and should not be used as the sole basis for treatment or other patient management decisions.  A negative result may occur with improper specimen collection / handling, submission of specimen other than nasopharyngeal swab, presence of viral mutation(s) within the areas targeted by this assay, and inadequate number of viral copies (<250 copies / mL). A negative result must be combined with clinical observations, patient history, and epidemiological information. Fact Sheet for Patients:   BoilerBrush.com.cyhttps://www.fda.gov/media/136312/download Fact Sheet for Healthcare Providers: https://pope.com/https://www.fda.gov/media/136313/download This test is not yet approved or cleared  by the [place] FDA and has been authorized for detection and/or diagnosis of SARS-CoV-2 by FDA under an  Emergency Use Authorization (EUA).  This EUA will remain in effect (meaning this test can be used) for the duration of the COVID-19 declaration under Section 564(b)(1) of the Act, 21 U.S.C. section 360bbb-3(b)(1), unless the authorization is terminated or revoked sooner. Performed at [facility], [address] [place], [place] [address]       Studies: CT ANGIO HEAD W OR WO CONTRAST  Result Date: 03/04/2020 CLINICAL DATA:  Code stroke follow-up EXAM: CT ANGIOGRAPHY HEAD AND NECK TECHNIQUE: Multidetector CT imaging of the head and neck was performed using the standard protocol during bolus administration of intravenous contrast. Multiplanar CT image reconstructions and MIPs were obtained to evaluate the vascular anatomy. Carotid stenosis measurements (when applicable) are obtained utilizing NASCET criteria, using the distal internal carotid diameter as the denominator. CONTRAST:  50mL OMNIPAQUE IOHEXOL 350 MG/ML SOLN COMPARISON:  None. FINDINGS: CTA NECK FINDINGS Aortic arch: Calcified and noncalcified plaque along the arch. Great vessel origins are patent. Circumferential plaque at the left common carotid origin causing less than 50% stenosis. Right carotid system: Patent. Minimal calcified plaque at the ICA origin without measurable stenosis. Left carotid system: Patent. Calcified and noncalcified plaque along the proximal internal carotid causing less than 50% stenosis. Vertebral arteries: Patent. Plaque at the right vertebral origin causes mild stenosis. There is focal severe stenosis of the left vertebral artery at the C3-C4 level. Otherwise mild multifocal irregularity related to cervical spine degenerative changes. Skeleton: Degenerative changes of the cervical spine Other neck: No mass or adenopathy. Upper chest: No apical lung mass. Review of the MIP images confirms the above findings CTA HEAD FINDINGS Anterior circulation: Intracranial internal carotid arteries are patent with mild  calcified plaque. Anterior cerebral arteries are patent with anterior communicating artery present. Tandem moderate to marked stenoses of the right A2 ACA. Irregularity of the left A3 ACA with focal marked stenosis. Middle cerebral arteries are patent. Moderate to marked stenosis of the distal left M1 MCA extending into M2 branch with diminished flow. Additional multifocal bilateral distal MCA stenoses. Posterior circulation: Intracranial vertebral arteries, basilar artery, and posterior cerebral arteries are patent. Atherosclerotic irregularity of both posterior cerebral arteries with multiple moderate and marked stenoses of the P2 segments Venous sinuses: As permitted by contrast timing, patent. Review of the MIP images confirms the above findings IMPRESSION: No large vessel occlusion. Plaque at the proximal left ICA causing less than 50% stenosis. No measurable stenosis at the right ICA origin. Focal severe stenosis of the left V2 vertebral artery without apparent distal flow limitation. Significant intracranial atherosclerosis as detailed above involving anterior and posterior circulations.  Preliminary results were communicated to Dr. Amada Jupiter at 11:31 amon 5/10/2021by text page via the [facility] messaging system. Electronically Signed   By: Guadlupe Spanish M.D.   On: 03/04/2020 11:53   DG Chest 2 View  Result Date: 03/04/2020 CLINICAL DATA:  Slurred speech, dizziness EXAM: CHEST - 2 VIEW COMPARISON:  None. FINDINGS: Heart size is normal. Atherosclerotic calcification of the aortic knob. Prior descending aortic stent graft. There is a well-circumscribed 6.4 cm ovoid density adjacent to the mid descending thoracic aorta, which could potentially reflect an excluded aneurysm sac given the presence of the adjacent stent graft. No focal airspace consolidation, pleural effusion, or pneumothorax. No acute osseous findings. IMPRESSION: Smoothly marginated 6.4 cm ovoid density adjacent to the mid descending thoracic  aorta, which could potentially reflect an excluded aneurysm sac given the presence of the adjacent stent graft. Further evaluation with contrast-enhanced chest CT is recommended if there are no available outside prior studies. Electronically Signed   By: Duanne Guess D.O.   On: 03/04/2020 13:50   CT ANGIO NECK W OR WO CONTRAST  Result Date: 03/04/2020 CLINICAL DATA:  Code stroke follow-up EXAM: CT ANGIOGRAPHY HEAD AND NECK TECHNIQUE: Multidetector CT imaging of the head and neck was performed using the standard protocol during bolus administration of intravenous contrast. Multiplanar CT image reconstructions and MIPs were obtained to evaluate the vascular anatomy. Carotid stenosis measurements (when applicable) are obtained utilizing NASCET criteria, using the distal internal carotid diameter as the denominator. CONTRAST:  38mL OMNIPAQUE IOHEXOL 350 MG/ML SOLN COMPARISON:  None. FINDINGS: CTA NECK FINDINGS Aortic arch: Calcified and noncalcified plaque along the arch. Great vessel origins are patent. Circumferential plaque at the left common carotid origin causing less than 50% stenosis. Right carotid system: Patent. Minimal calcified plaque at the ICA origin without measurable stenosis. Left carotid system: Patent. Calcified and noncalcified plaque along the proximal internal carotid causing less than 50% stenosis. Vertebral arteries: Patent. Plaque at the right vertebral origin causes mild stenosis. There is focal severe stenosis of the left vertebral artery at the C3-C4 level. Otherwise mild multifocal irregularity related to cervical spine degenerative changes. Skeleton: Degenerative changes of the cervical spine Other neck: No mass or adenopathy. Upper chest: No apical lung mass. Review of the MIP images confirms the above findings CTA HEAD FINDINGS Anterior circulation: Intracranial internal carotid arteries are patent with mild calcified plaque. Anterior cerebral arteries are patent with anterior  communicating artery present. Tandem moderate to marked stenoses of the right A2 ACA. Irregularity of the left A3 ACA with focal marked stenosis. Middle cerebral arteries are patent. Moderate to marked stenosis of the distal left M1 MCA extending into M2 branch with diminished flow. Additional multifocal bilateral distal MCA stenoses. Posterior circulation: Intracranial vertebral arteries, basilar artery, and posterior cerebral arteries are patent. Atherosclerotic irregularity of both posterior cerebral arteries with multiple moderate and marked stenoses of the P2 segments Venous sinuses: As permitted by contrast timing, patent. Review of the MIP images confirms the above findings IMPRESSION: No large vessel occlusion. Plaque at the proximal left ICA causing less than 50% stenosis. No measurable stenosis at the right ICA origin. Focal severe stenosis of the left V2 vertebral artery without apparent distal flow limitation. Significant intracranial atherosclerosis as detailed above involving anterior and posterior circulations. Preliminary results were communicated to Dr. Amada Jupiter at 11:31 amon 5/10/2021by text page via the [facility] messaging system. Electronically Signed   By: Guadlupe Spanish M.D.   On: 03/04/2020 11:53   ECHOCARDIOGRAM COMPLETE  Result  Date: 03/04/2020    ECHOCARDIOGRAM REPORT   Patient Name:   KENYATA GUESS Date of Exam: 03/04/2020 Medical Rec #:  [id number]        Height:       70.0 in Accession #:    [id number]       Weight:       147.9 lb Date of Birth:  1928/07/22        BSA:          1.836 m Patient Age:    89 years         BP:           128/64 mmHg Patient Gender: M                HR:           70 bpm. Exam Location:  Inpatient Procedure: 2D Echo, Cardiac Doppler and Color Doppler Indications:    TIA 435.96 / G45.9  History:        Patient has no prior history of Echocardiogram examinations.  Sonographer:    Elmarie Shiley Dance Referring Phys: [id number] Emeline General IMPRESSIONS  1. Left  ventricular ejection fraction, by estimation, is 55 to 60%. The left ventricle has normal function. The left ventricle has no regional wall motion abnormalities. There is mild left ventricular hypertrophy. Left ventricular diastolic parameters are indeterminate.  2. Right ventricular systolic function is normal. The right ventricular size is normal. There is normal pulmonary artery systolic pressure. The estimated right ventricular systolic pressure is 24.9 mmHg.  3. The mitral valve is normal in structure. Trivial mitral valve regurgitation.  4. The tricuspid valve is abnormal. Thickened leaflets. Mild regurgitation.  5. The aortic valve is tricuspid. Aortic valve regurgitation is mild to moderate. No aortic stenosis is present.  6. The inferior vena cava is normal in size with greater than 50% respiratory variability, suggesting right atrial pressure of 3 mmHg.  7. Aortic dilatation noted. Aneurysm of the aortic root, measuring 47 mm. FINDINGS  Left Ventricle: Left ventricular ejection fraction, by estimation, is 55 to 60%. The left ventricle has normal function. The left ventricle has no regional wall motion abnormalities. The left ventricular internal cavity size was normal in size. There is  mild left ventricular hypertrophy. Left ventricular diastolic parameters are indeterminate. Right Ventricle: The right ventricular size is normal. Right vetricular wall thickness was not assessed. Right ventricular systolic function is normal. There is normal pulmonary artery systolic pressure. The tricuspid regurgitant velocity is 2.34 m/s, and with an assumed right atrial pressure of 3 mmHg, the estimated right ventricular systolic pressure is 24.9 mmHg. Left Atrium: Left atrial size was normal in size. Right Atrium: Right atrial size was normal in size. Pericardium: Trivial pericardial effusion is present. Mitral Valve: The mitral valve is normal in structure. Trivial mitral valve regurgitation. Tricuspid Valve:  Thickened leaflets. The tricuspid valve is abnormal. Tricuspid valve regurgitation is mild. Aortic Valve: The aortic valve is tricuspid. Aortic valve regurgitation is mild to moderate. Aortic regurgitation PHT measures 473 msec. No aortic stenosis is present. Pulmonic Valve: The pulmonic valve was not well visualized. Pulmonic valve regurgitation is mild. Aorta: Aortic dilatation noted. There is an aneurysm involving the aortic root. The aneurysm measures 47 mm. Venous: The inferior vena cava is normal in size with greater than 50% respiratory variability, suggesting right atrial pressure of 3 mmHg. IAS/Shunts: The interatrial septum was not well visualized.  LEFT VENTRICLE PLAX 2D LVIDd:  4.20 cm LVIDs:         3.40 cm LV PW:         0.90 cm LV IVS:        1.00 cm LVOT diam:     2.10 cm LV SV:         57 LV SV Index:   31 LVOT Area:     3.46 cm  RIGHT VENTRICLE             IVC RV Basal diam:  3.40 cm     IVC diam: 1.70 cm RV Mid diam:    2.10 cm RV S prime:     10.40 cm/s TAPSE (M-mode): 2.0 cm LEFT ATRIUM             Index       RIGHT ATRIUM           Index LA diam:        2.40 cm 1.31 cm/m  RA Area:     18.40 cm LA Vol (A2C):   39.5 ml 21.51 ml/m RA Volume:   48.90 ml  26.63 ml/m LA Vol (A4C):   50.5 ml 27.50 ml/m LA Biplane Vol: 44.2 ml 24.07 ml/m  AORTIC VALVE LVOT Vmax:   93.60 cm/s LVOT Vmean:  57.350 cm/s LVOT VTI:    0.164 m AI PHT:      473 msec  AORTA Ao Root diam: 4.70 cm Ao Asc diam:  3.40 cm MITRAL VALVE               TRICUSPID VALVE MV Area (PHT): 2.62 cm    TR Peak grad:   21.9 mmHg MV Decel Time: 289 msec    TR Vmax:        234.00 cm/s MV E velocity: 0.51 cm/s MV A velocity: 72.30 cm/s  SHUNTS MV E/A ratio:  0.01        Systemic VTI:  0.16 m                            Systemic Diam: 2.10 cm Oswaldo Milian MD Electronically signed by Oswaldo Milian MD Signature Date/Time: 03/04/2020/9:18:28 PM    Final    CT HEAD CODE STROKE WO CONTRAST  Result Date: 03/04/2020 CLINICAL  DATA:  Code stroke.  Dysarthria, facial droop EXAM: CT HEAD WITHOUT CONTRAST TECHNIQUE: Contiguous axial images were obtained from the base of the skull through the vertex without intravenous contrast. COMPARISON:  None. FINDINGS: Brain: There is no acute intracranial hemorrhage, mass effect, or edema. Gray-white differentiation is preserved. Prominence of the ventricles and sulci reflects generalized parenchymal volume loss. There is no extra-axial fluid collection. Patchy hypoattenuation in the supratentorial white matter is nonspecific but may reflect mild chronic microvascular ischemic changes. Vascular: No hyperdense vessel. There is intracranial atherosclerotic calcification at the skull base. Skull: Unremarkable Sinuses/Orbits: . minor mucosal thickening. Orbits are unremarkable. Other: Mastoid air cells are clear. ASPECTS ([place] Stroke Program Early CT Score) - Ganglionic level infarction (caudate, lentiform nuclei, internal capsule, insula, M1-M3 cortex): 7 - Supraganglionic infarction (M4-M6 cortex): 3 Total score (0-10 with 10 being normal): 10 IMPRESSION: No acute intracranial hemorrhage or evidence of acute infarction. ASPECT score is 10. Chronic microvascular ischemic changes. These results were communicated to Dr. Leonel Ramsay at 11:21 amon 5/10/2021by text page via the [facility] messaging system. Electronically Signed   By: Macy Mis M.D.   On: 03/04/2020 11:23    Scheduled Meds: .  aspirin EC  81 mg Oral Daily  . atorvastatin  80 mg Oral Daily  . cilostazol  50 mg Oral BID  . donepezil  10 mg Oral QHS  . fenofibrate  54 mg Oral Daily  . gabapentin  100 mg Oral BID  . heparin  5,000 Units Subcutaneous Q12H  . insulin aspart  0-9 Units Subcutaneous TID WC  . insulin glargine  10 Units Subcutaneous QHS  . levothyroxine  50 mcg Oral QAC breakfast  . sertraline  50 mg Oral Daily  . tamsulosin  0.4 mg Oral Daily    Continuous Infusions: . sodium chloride 75 mL/hr at 03/04/20 2214      LOS: 0 days     Darlin Drop, MD [facility] Pager [phone]  If 7PM-7AM, please contact night-coverage [email or web address] Password [facility] 03/05/2020, 9:50 AM

## 2020-03-05 NOTE — Evaluation (Signed)
Physical Therapy Evaluation Patient Details Name: Joe Peters MRN: [id number] DOB: [birth year] Today's Date: 03/05/2020   History of Present Illness  Pt is an 86 yo male admitted from ALF with c/o difficulty finding his words and not feeling well when up in standing. Per chart, pt was walking with walker when he stopped, could not get his words out and did not move. Pt was safely returned to sitting position.    Clinical Impression  Pt admitted with above diagnosis. Pt reports he has had some falls in the past, has difficulty relaying history as well as family names and details, also currently with some word finding difficulties. Pt stood from EOB with min A and after ~15 secs began to drool, become pale, and stop speaking. He did follow commands to sit and once sitting was interactive again. Pt does not sense this coming on and does not realize what is happening. BP after the first episode was 119/64 and after the second time 106/56. SpO2 100% on RA and HR stable in 80's. Pt unsafe to attempt ambulation currently. Was able to transfer to recliner with SPT and min A without becoming symptomatic.  Pt currently with functional limitations due to the deficits listed below (see PT Problem List). Pt will benefit from skilled PT to increase their independence and safety with mobility to allow discharge to the venue listed below.       Follow Up Recommendations SNF;Supervision/Assistance - 24 hour (pt currently at SNF appropriate level of care but current ALF may be able to give him 24 hr supervision)    Equipment Recommendations  None recommended by PT    Recommendations for Other Services       Precautions / Restrictions Precautions Precautions: Fall Precaution Comments: Pt stated he has fallen in past. Restrictions Weight Bearing Restrictions: No Other Position/Activity Restrictions: watch BP      Mobility  Bed Mobility Overal bed mobility: Needs Assistance Bed Mobility: Supine to  Sit;Sit to Supine     Supine to sit: Min assist Sit to supine: Min guard   General bed mobility comments: Pt required cues to come to EOB and put feet on floor and min assist to achieve a full sitting position.  Pt with posterior lean.  Transfers Overall transfer level: Needs assistance Equipment used: Rolling walker (2 wheeled) Transfers: Sit to/from [facility] Sit to Stand: +2 physical assistance;Min assist Stand pivot transfers: Min assist;+2 physical assistance       General transfer comment: vc's for hand placement and min A for fwd wt shift, +2 for safety as pt with decreased awareness (drooling, pale, not speaking). This happened with standing >15 secs, 2nd time not as marked, pt could still verbalize. Pt able to pivot to chair quickly with min A +2 without being symptomatic. Pt BP in sitting after first episode was 119/64, sitting after second episode (could not stand long enough to get standing pressure) was 106/56  Ambulation/Gait             General Gait Details: unsafe to attempt  Stairs            Wheelchair Mobility    Modified Rankin (Stroke Patients Only) Modified Rankin (Stroke Patients Only) Pre-Morbid Rankin Score: Moderate disability Modified Rankin: Moderately severe disability     Balance Overall balance assessment: Needs assistance Sitting-balance support: Feet supported;Bilateral upper extremity supported Sitting balance-Leahy Scale: Poor Sitting balance - Comments: Pt with posteior lean and sometimes R lean Postural control: Right lateral lean;Posterior lean  Standing balance support: Bilateral upper extremity supported;During functional activity Standing balance-Leahy Scale: Poor Standing balance comment: Pt relies on walker or outside assist to stand.                             Pertinent Vitals/Pain Pain Assessment: No/denies pain    Home Living Family/patient expects to be discharged to:: Assisted  living               Home Equipment: Walker - 4 wheels;Shower seat Additional Comments: Pt has rollator    Prior Function Level of Independence: Needs assistance   Gait / Transfers Assistance Needed: walked with walker  ADL's / Homemaking Assistance Needed: got some assist with bathing.  Pt states he dressed self and goes to dining room for meals.          Hand Dominance   Dominant Hand: Right    Extremity/Trunk Assessment   Upper Extremity Assessment Upper Extremity Assessment: Defer to OT evaluation    Lower Extremity Assessment Lower Extremity Assessment: LLE deficits/detail;RLE deficits/detail RLE Deficits / Details: grossly 4+/5 throughout, noted hamstring tightness RLE Sensation: WNL RLE Coordination: decreased gross motor LLE Deficits / Details: knee ext 4/5, knee flex 4/5, hamstring tightness noted. Pt verbalizes that he feels LLE mildly weaker than R. No knee buckling or trendelenberg in standing LLE Sensation: WNL LLE Coordination: decreased gross motor    Cervical / Trunk Assessment Cervical / Trunk Assessment: Kyphotic  Communication   Communication: Expressive difficulties  Cognition Arousal/Alertness: Awake/alert Behavior During Therapy: WFL for tasks assessed/performed Overall Cognitive Status: No family/caregiver present to determine baseline cognitive functioning                                 General Comments: Pt either not aware of month (stated August) or could not come up with the word.  Difficult to know. Stated it was August.  Pt with significant word finding/expressive difficulties.  Pt was aware of year.  Pt with dementia.      General Comments General comments (skin integrity, edema, etc.): Pt unaware that these episodes are occuring and does not sense them coming. Has to be made to sit back down. Did not lose consciousness competely. He reports that he thinks this has been happening at home    Exercises      Assessment/Plan    PT Assessment Patient needs continued PT services  PT Problem List Decreased strength;Decreased activity tolerance;Decreased balance;Decreased mobility;Decreased coordination;Decreased cognition;Decreased knowledge of use of DME;Decreased safety awareness;Decreased knowledge of precautions;Cardiopulmonary status limiting activity       PT Treatment Interventions DME instruction;Gait training;Functional mobility training;Therapeutic activities;Therapeutic exercise;Balance training;Neuromuscular re-education;Cognitive remediation;Patient/family education    PT Goals (Current goals can be found in the Care Plan section)  Acute Rehab PT Goals Patient Stated Goal: to do more for myself again. PT Goal Formulation: With patient Time For Goal Achievement: 03/19/20 Potential to Achieve Goals: Good    Frequency Min 3X/week   Barriers to discharge        Co-evaluation PT/OT/SLP Co-Evaluation/Treatment: Yes Reason for Co-Treatment: Complexity of the patient's impairments (multi-system involvement);For patient/therapist safety;Necessary to address cognition/behavior during functional activity PT goals addressed during session: Mobility/safety with mobility;Balance;Proper use of DME OT goals addressed during session: ADL's and self-care       AM-PAC PT "6 Clicks" Mobility  Outcome Measure Help needed turning from your back to your  side while in a flat bed without using bedrails?: A Little Help needed moving from lying on your back to sitting on the side of a flat bed without using bedrails?: A Little Help needed moving to and from a bed to a chair (including a wheelchair)?: A Lot Help needed standing up from a chair using your arms (e.g., wheelchair or bedside chair)?: A Lot Help needed to walk in hospital room?: Total Help needed climbing 3-5 steps with a railing? : Total 6 Click Score: 12    End of Session Equipment Utilized During Treatment: Gait belt Activity  Tolerance: Treatment limited secondary to medical complications (Comment)(fluctuating level of consciousness) Patient left: with call bell/phone within reach;in chair;with chair alarm set Nurse Communication: Mobility status PT Visit Diagnosis: Unsteadiness on feet (R26.81);Repeated falls (R29.6);Difficulty in walking, not elsewhere classified (R26.2)    Time: 5465-0354 PT Time Calculation (min) (ACUTE ONLY): 30 min   Charges:   PT Evaluation $PT Eval Moderate Complexity: 1 Mod          Lyanne Co, PT  Acute Rehab Services  Pager [phone] Office [phone]   Lawana Chambers Koray Soter 03/05/2020, 11:12 AM

## 2020-03-05 NOTE — Progress Notes (Signed)
Joe Peters Jul 15, 1928 03/05/2020  To Whom It May Concern:  Please be advised that the above-named patient has a primary diagnosis of dementia which supersedes any psychiatric diagnosis.

## 2020-03-05 NOTE — Progress Notes (Signed)
EEG completed, results pending. 

## 2020-03-05 NOTE — NC FL2 (Signed)
[place] MEDICAID FL2 LEVEL OF CARE SCREENING TOOL     IDENTIFICATION  Patient Name: Joe Peters Birthdate: [birth year] Sex: male Admission Date (Current Location): 03/04/2020  [facility] and [place] Number:  Producer, television/film/video and Address:  [place] [place], [address] [place], [place] [address]      Provider Number: [id number]  Attending Physician Name and Address:  Darlin Drop, DO  Relative Name and Phone Number:       Current Level of Care: Hospital Recommended Level of Care: Skilled Nursing Facility Prior Approval Number:    Date Approved/Denied:   PASRR Number:    Discharge Plan: SNF    Current Diagnoses: Patient Active Problem List   Diagnosis Date Noted  . TIA (transient ischemic attack) 03/04/2020  . HTN (hypertension) 03/04/2020  . DM (diabetes mellitus), secondary, uncontrolled, with neurologic complications (HCC) 03/04/2020    Orientation RESPIRATION BLADDER Height & Weight     Self, Place  Normal Continent Weight: 67.1 kg Height:  5\' 11"  (180.3 cm)  BEHAVIORAL SYMPTOMS/MOOD NEUROLOGICAL BOWEL NUTRITION STATUS      Continent Diet(heart healthy/ carb modified with thin liquids)  AMBULATORY STATUS COMMUNICATION OF NEEDS Skin   Extensive Assist Verbally Normal                       Personal Care Assistance Level of Assistance  Bathing, Feeding, Dressing Bathing Assistance: Maximum assistance Feeding assistance: Independent Dressing Assistance: Limited assistance     Functional Limitations Info  Sight, Hearing, Speech Sight Info: Impaired Hearing Info: Impaired Speech Info: Impaired(slur/dysarthria)    SPECIAL CARE FACTORS FREQUENCY  PT (By licensed PT), OT (By licensed OT), Speech therapy     PT Frequency: 5x/wk OT Frequency: 5x/wk     Speech Therapy Frequency: 3/wk      Contractures Contractures Info: Not present    Additional Factors Info  Code Status, Allergies, Psychotropic, Insulin Sliding  Scale Code Status Info: Full Allergies Info: penicillin/ sulfa antibiotics Psychotropic Info: Aricept 10 mg PO daily/ Neurontin 100 mg PO BID/ Zoloft 50 mg PO daily Insulin Sliding Scale Info: Novolog 0-9 units SQ three times a day/ Lantus 5 units at bedtime       Current Medications (03/05/2020):  This is the current hospital active medication list Current Facility-Administered Medications  Medication Dose Route Frequency Provider Last Rate Last Admin  . acetaminophen (TYLENOL) tablet 650 mg  650 mg Oral Q4H PRN 05/05/2020 T, MD       Or  . acetaminophen (TYLENOL) 160 MG/5ML solution 650 mg  650 mg Per Tube Q4H PRN Mikey College T, MD       Or  . acetaminophen (TYLENOL) suppository 650 mg  650 mg Rectal Q4H PRN Mikey College T, MD      . cilostazol (PLETAL) tablet 50 mg  50 mg Oral BID Mikey College T, MD   50 mg at 03/05/20 1030  . donepezil (ARICEPT) tablet 10 mg  10 mg Oral QHS 05/05/20 T, MD      . fenofibrate tablet 54 mg  54 mg Oral Daily Mikey College T, MD   54 mg at 03/05/20 1030  . gabapentin (NEURONTIN) capsule 100 mg  100 mg Oral BID 05/05/20 T, MD   100 mg at 03/05/20 1030  . heparin injection 5,000 Units  5,000 Units Subcutaneous Q12H 05/05/20, MD   5,000 Units at 03/05/20 1031  . hydrALAZINE (APRESOLINE) tablet 25 mg  25 mg Oral Q6H PRN Wynetta Fines T, MD      . insulin aspart (novoLOG) injection 0-9 Units  0-9 Units Subcutaneous TID WC Lequita Halt, MD   2 Units at 03/05/20 1258  . insulin glargine (LANTUS) injection 5 Units  5 Units Subcutaneous QHS Hall, Carole N, DO      . levothyroxine (SYNTHROID) tablet 50 mcg  50 mcg Oral QAC breakfast Wynetta Fines T, MD   50 mcg at 03/05/20 0520  . senna-docusate (Senokot-S) tablet 1 tablet  1 tablet Oral QHS PRN Wynetta Fines T, MD      . sertraline (ZOLOFT) tablet 50 mg  50 mg Oral Daily Wynetta Fines T, MD   50 mg at 03/05/20 1030  . simvastatin (ZOCOR) tablet 40 mg  40 mg Oral q1800 Burnetta Sabin L, NP      . tamsulosin (FLOMAX)  capsule 0.4 mg  0.4 mg Oral Daily Wynetta Fines T, MD   0.4 mg at 03/05/20 1030     Discharge Medications: Please see discharge summary for a list of discharge medications.  Relevant Imaging Results:  Relevant Lab Results:   Additional Information SS#: [id number]  Pollie Friar, RN

## 2020-03-05 NOTE — Progress Notes (Signed)
SLP Cancellation Note  Patient Details Name: Joe Peters MRN: [id number] DOB: 1928/04/09   Cancelled treatment:       Reason Eval/Treat Not Completed: SLP screened, no needs identified, will sign off. Given history of dementia in ALF resident, and documentation of resolution of most scute speech symptoms, no further SLP w/u needed at this time.    Dollye Glasser, Riley Nearing 03/05/2020, 8:06 AM

## 2020-03-05 NOTE — Social Work (Signed)
CSW was unable to complete sbirt due to pt not being fully oriented. CSW may attempt to complete at more appropriate time.   Shalece Staffa, LCSWA, LCASA Clinical Social Worker [phone]    

## 2020-03-05 NOTE — Evaluation (Signed)
Occupational Therapy Evaluation Patient Details Name: Joe Peters MRN: [id number] DOB: [birth year] Today's Date: 03/05/2020    History of Present Illness Pt is an 86 yo male admitted from ALF with c/o difficulty finding his words and not feeling well when up in standing. Per chart, pt was walking with walker when he stopped, could not get his words out and did not move. Pt was safely returned to sitting position.     Clinical Impression   Pt admitted with the above diagnosis and has the deficits listed below. Pt would benefit from cont OT to increase ability to tolerate adls in standing and increase independence in adls back to baseline.  Pt lives in ALF and his daughter works there.  Pt has had falls in the past and currently is not tolerating standing. BPs taken in sitting was normal and in standing dropped but unsure how much as pt could not tolerate full standing for entirety of BP to read.  Pt became pale, shaky, drooling and less communicative until he returned to sitting. Pt does not seem to know that it is coming on until it is too late.  Feel he could return to ALF with HHOT IF he had enough assistance there to ensure his safety. At this time, it is not safe for pt to be on his feet without someone with him. Will continue to follow.     Follow Up Recommendations  SNF;Supervision/Assistance - 24 hour    Equipment Recommendations  None recommended by OT    Recommendations for Other Services Speech consult     Precautions / Restrictions Precautions Precautions: Fall Precaution Comments: Pt stated he has fallen in past. Restrictions Weight Bearing Restrictions: No Other Position/Activity Restrictions: Pt transferred sit to stand x2 and on both occasions face turned white, drooling occured, pt not answering questions and was returned to sitting position.  Attempted to get orthostatics.  See PT note for BPs. Pt unable to tolerate standing long enough to get BP, but BP did drop even  finishing BP in sitting.      Mobility Bed Mobility Overal bed mobility: Needs Assistance Bed Mobility: Supine to Sit;Sit to Supine     Supine to sit: Min assist Sit to supine: Min guard   General bed mobility comments: Pt required cues to come to EOB and put feet on floor and min assist to achieve a full sitting position.  Pt with posterior lean.  Transfers Overall transfer level: Needs assistance   Transfers: Sit to/from Stand;Stand Pivot Transfers Sit to Stand: +2 physical assistance;Min assist Stand pivot transfers: Min assist;+2 physical assistance       General transfer comment: +2 assist only needed bc pt becomes symptomatic when standing.      Balance Overall balance assessment: Needs assistance Sitting-balance support: Feet supported;Bilateral upper extremity supported Sitting balance-Leahy Scale: Poor Sitting balance - Comments: Pt with posteior lean and sometimes R lean Postural control: Right lateral lean;Posterior lean Standing balance support: Bilateral upper extremity supported;During functional activity Standing balance-Leahy Scale: Poor Standing balance comment: Pt relies on walker or outside assist to stand.                           ADL either performed or assessed with clinical judgement   ADL Overall ADL's : Needs assistance/impaired Eating/Feeding: Set up;Sitting   Grooming: Wash/dry hands;Wash/dry face;Oral care;Set up;Sitting   Upper Body Bathing: Set up;Sitting   Lower Body Bathing: Moderate assistance;Sit to/from stand;Cueing for compensatory  techniques   Upper Body Dressing : Minimal assistance;Sitting   Lower Body Dressing: Moderate assistance;Sit to/from stand;Cueing for compensatory techniques   Toilet Transfer: Minimal assistance;+2 for physical assistance;Stand-pivot;BSC;RW Toilet Transfer Details (indicate cue type and reason): +2 for safety.  Pt on two occasions became quite, washed out and looked as if he would pass out  after standing for less than 30 seconds.  Toileting- Clothing Manipulation and Hygiene: Minimal assistance;Sit to/from stand;Cueing for compensatory techniques       Functional mobility during ADLs: Minimal assistance;Rolling walker(could not tolerate standing for more than 30 seconds) General ADL Comments: Pt limited most with LE adls due to inability to tolerate standing due to becoming pale, noncommunicative and drooling.  Feel pt needs work up before therapy can continue to evaluate pts performance in standing.  Pt has assist from staff at assisted living but will need 24/7 assist if he was to return in this state.      Vision Baseline Vision/History: Wears glasses;No visual deficits Wears Glasses: Reading only Patient Visual Report: No change from baseline Vision Assessment?: No apparent visual deficits     Perception Perception Perception Tested?: No   Praxis Praxis Praxis tested?: Within functional limits    Pertinent Vitals/Pain Pain Assessment: No/denies pain     Hand Dominance Right   Extremity/Trunk Assessment Upper Extremity Assessment Upper Extremity Assessment: Overall WFL for tasks assessed   Lower Extremity Assessment Lower Extremity Assessment: Defer to PT evaluation       Communication Communication Communication: Expressive difficulties   Cognition Arousal/Alertness: Awake/alert Behavior During Therapy: WFL for tasks assessed/performed Overall Cognitive Status: No family/caregiver present to determine baseline cognitive functioning                                 General Comments: Pt either not aware of month (stated August) or could not come up with the world.  Difficult to know. Stated it was August.  Pt with significant word finding/expressive difficulties.  Pt was aware of year.  Pt with dementia.   General Comments  Pt most limted by inability to tolerate standing due to not feeling well in standing. Pt becomes pale, drools, and gets  a blank stare in standing.  Pt really not aware it is happening and has to be made to sit down.  When sitting, pt able to groom, feed self and converse with therapist.  Pt with some word finding deficits.    Exercises     Shoulder Instructions      Home Living Family/patient expects to be discharged to:: Assisted living                             Home Equipment: Walker - 4 wheels;Shower seat   Additional Comments: Pt has rollator      Prior Functioning/Environment Level of Independence: Needs assistance  Gait / Transfers Assistance Needed: walked with walker ADL's / Homemaking Assistance Needed: got some assist with bathing.  Pt states he dressed self and goes to dining room for meals.              OT Problem List: Decreased activity tolerance;Impaired balance (sitting and/or standing);Decreased cognition;Decreased safety awareness;Decreased knowledge of use of DME or AE      OT Treatment/Interventions: Self-care/ADL training;Therapeutic activities;Balance training    OT Goals(Current goals can be found in the care plan section) Acute Rehab OT Goals  Patient Stated Goal: to do more for myself again. OT Goal Formulation: With patient Time For Goal Achievement: 03/19/20 Potential to Achieve Goals: Good ADL Goals Pt Will Perform Grooming: with supervision;standing Pt Will Perform Lower Body Bathing: with min assist;sit to/from stand Pt Will Perform Lower Body Dressing: with supervision;sit to/from stand Additional ADL Goal #1: Pt willl walk to bathroom with walker and do all toileting with supervision.  OT Frequency: Min 2X/week   Barriers to D/C:    Lives in ALF.  Daughter is nurse there.  Unsure how much assist they can provide.  Would not want pt walking on walker alone at this point.       Co-evaluation PT/OT/SLP Co-Evaluation/Treatment: Yes Reason for Co-Treatment: Complexity of the patient's impairments (multi-system involvement);To address  functional/ADL transfers PT goals addressed during session: Mobility/safety with mobility OT goals addressed during session: ADL's and self-care      AM-PAC OT "6 Clicks" Daily Activity     Outcome Measure Help from another person eating meals?: A Little Help from another person taking care of personal grooming?: A Little Help from another person toileting, which includes using toliet, bedpan, or urinal?: A Lot Help from another person bathing (including washing, rinsing, drying)?: A Lot Help from another person to put on and taking off regular upper body clothing?: A Little Help from another person to put on and taking off regular lower body clothing?: A Lot 6 Click Score: 15   End of Session Equipment Utilized During Treatment: Rolling walker Nurse Communication: Mobility status;Other (comment)(drop in BP standing )  Activity Tolerance: Treatment limited secondary to medical complications (Comment)(Pt unable to tolerate standing.) Patient left: in chair;with call bell/phone within reach;with chair alarm set  OT Visit Diagnosis: Unsteadiness on feet (R26.81)                Time: 8099-8338 OT Time Calculation (min): 34 min Charges:  OT General Charges $OT Visit: 1 Visit OT Evaluation $OT Eval Moderate Complexity: 1 Mod  Glenford Peers 03/05/2020, 9:51 AM

## 2020-03-05 NOTE — TOC Initial Note (Signed)
Transition of Care [facility]) - Initial/Assessment Note    Patient Details  Name: Joe Peters MRN: [id number] Date of Birth: 09-17-1928  [facility] CM/SW Contact:    Pollie Friar, RN Phone Number: 03/05/2020, 3:44 PM  Clinical Narrative:                 CM met with the patient in his room and then reached out to his step daughter/ HCPOA: Robin. She is accepting of him needing rehab and being faxed out in the [facility] area. She wants to make sure the hospital finds out why he gets so weak prior to d/c though. FL2 completed and information sent out. Info sent to [place] must for [facility] d/t his dementia.  TOC following.  Expected Discharge Plan: Skilled Nursing Facility Barriers to Discharge: Continued Medical Work up   Patient Goals and CMS Choice   CMS Medicare.gov Compare Post Acute Care list provided to:: Patient Represenative (must comment) Choice offered to / list presented to : Adult Children  Expected Discharge Plan and Services Expected Discharge Plan: [place] In-house Referral: Clinical Social Work Discharge Planning Services: CM Consult Post Acute Care Choice: [place] Living arrangements for the past 2 months: Apartment                                      Prior Living Arrangements/Services Living arrangements for the past 2 months: Apartment Lives with:: Facility Resident Patient language and need for interpreter reviewed:: Yes Do you feel safe going back to the place where you live?: Yes      Need for Family Participation in Patient Care: Yes (Comment)     Criminal Activity/Legal Involvement Pertinent to Current Situation/Hospitalization: No - Comment as needed  Activities of Daily Living      Permission Sought/Granted                  Emotional Assessment Appearance:: Appears stated age Attitude/Demeanor/Rapport: Engaged Affect (typically observed): Accepting Orientation: : Oriented to  Self, Oriented to Place   Psych Involvement: No (comment)  Admission diagnosis:  TIA (transient ischemic attack) [G45.9] Patient Active Problem List   Diagnosis Date Noted  . TIA (transient ischemic attack) 03/04/2020  . HTN (hypertension) 03/04/2020  . DM (diabetes mellitus), secondary, uncontrolled, with neurologic complications ([place]) 71/03/2693   PCP:  Marton Redwood, MD Pharmacy:  No Pharmacies Listed    Social Determinants of Health (SDOH) Interventions    Readmission Risk Interventions No flowsheet data found.

## 2020-03-06 ENCOUNTER — Inpatient Hospital Stay (HOSPITAL_COMMUNITY): Payer: Medicare Other

## 2020-03-06 LAB — BASIC METABOLIC PANEL
Anion gap: 5 (ref 5–15)
BUN: 17 mg/dL (ref 8–23)
CO2: 27 mmol/L (ref 22–32)
Calcium: 8.6 mg/dL — ABNORMAL LOW (ref 8.9–10.3)
Chloride: 101 mmol/L (ref 98–111)
Creatinine, Ser: 1.37 mg/dL — ABNORMAL HIGH (ref 0.61–1.24)
GFR calc Af Amer: 53 mL/min — ABNORMAL LOW (ref >60)
GFR calc non Af Amer: 45 mL/min — ABNORMAL LOW (ref >60)
Glucose, Bld: 94 mg/dL (ref 70–99)
Potassium: 3.8 mmol/L (ref 3.5–5.1)
Sodium: 133 mmol/L — ABNORMAL LOW (ref 135–145)

## 2020-03-06 LAB — GLUCOSE, CAPILLARY
Glucose-Capillary: 92 mg/dL (ref 70–99)
Glucose-Capillary: 164 mg/dL — ABNORMAL HIGH (ref 70–99)
Glucose-Capillary: 214 mg/dL — ABNORMAL HIGH (ref 70–99)
Glucose-Capillary: 173 mg/dL — ABNORMAL HIGH (ref 70–99)

## 2020-03-06 MED ORDER — LEVETIRACETAM ER 500 MG PO TB24
500.0000 mg | ORAL_TABLET | Freq: Every day | ORAL | Status: DC
  Administered 2020-03-06 – 2020-03-07 (×2): 500 mg via ORAL
  Filled 2020-03-06 (×2): qty 1

## 2020-03-06 NOTE — Consult Note (Signed)
Chief Complaint: Patient was seen in consultation today for near syncope/diagnostic cerebral arteriogram.  Referring Physician(s): Micki RileySethi, Pramod S  Supervising Physician: Baldemar Lenise Macedo Rodrigues, Katyucia  [facility]  History of Present Illness: [name] is a 86 y.o. male with a past medical history of hypertension, hyperlipidemia, PVD, ruptured AAA s/p repair, diabetes mellitus with associated diabetic neuropathy, and hypothyroidism. [facility] secondary to near syncopal episode with associated difficulty with word finding. In ED, CT head revealed no acute abnormalities, CTA head/neck revealed proximal left ICA and left VA V2 stenosis. He was admitted for further management. Neurology was consulted who recommended NIR consultation for possible diagnostic cerebral arteriogram to evaluate for vascular causes of near syncope.  CTA head/neck 03/04/2020: 1. No large vessel occlusion. 2. Plaque at the proximal left ICA causing less than 50% stenosis. No measurable stenosis at the right ICA origin. Focal severe stenosis of the left V2 vertebral artery without apparent distal flow limitation. 3. Significant intracranial atherosclerosis as detailed above involving anterior and posterior circulations.  NIR consulted by Dr. Pearlean BrownieSethi for possible image-guided diagnostic cerebral arteriogram. Patient awake and alert sitting in chair watching TV with no complaints at this time. Denies fever, chills, chest pain, dyspnea, abdominal pain, or headache.   History reviewed. No pertinent past medical history.   Allergies: Penicillins and Sulfa antibiotics  Medications: Prior to Admission medications   Medication Sig Start Date End Date Taking? Authorizing Provider  cilostazol (PLETAL) 50 MG tablet Take 50 mg by mouth 2 (two) times daily.   Yes [provider]  donepezil (ARICEPT) 10 MG tablet Take 10 mg by mouth at bedtime.   Yes [provider]  fenofibrate 54 MG tablet Take 54 mg by mouth daily.   Yes [provider]  gabapentin (NEURONTIN) 100 MG capsule Take 100 mg by mouth 2 (two) times daily.   Yes [provider]  insulin glargine (LANTUS) 100 unit/mL SOPN Inject 10 Units into the skin at bedtime.   Yes [provider]  levothyroxine (SYNTHROID) 50 MCG tablet Take 50 mcg by mouth daily before breakfast.   Yes [provider]  losartan-hydrochlorothiazide (HYZAAR) 100-25 MG tablet Take 1 tablet by mouth daily.   Yes [provider]  metFORMIN (GLUCOPHAGE) 500 MG tablet Take 500 mg by mouth in the morning and at bedtime.   Yes [provider]  sertraline (ZOLOFT) 50 MG tablet Take 50 mg by mouth daily.   Yes [provider]  simvastatin (ZOCOR) 40 MG tablet Take 40 mg by mouth daily.   Yes [provider]  tamsulosin (FLOMAX) 0.4 MG CAPS capsule Take 0.4 mg by mouth daily.   Yes [provider]     Family History  Problem Relation Age of Onset   Hypertension Mother    Hypertension Father     Social History   Socioeconomic History   Marital status: Unknown    Spouse name: Not on file   Number of children: Not on file   Years of education: Not on file   Highest education level: Not on file  Occupational History   Not on file  Tobacco Use   Smoking status: Not on file  Substance and Sexual Activity   Alcohol use: Not on file   Drug use: Not on file   Sexual activity: Not on file  Other Topics Concern   Not on file  Social History Narrative   Not on file  Social Determinants of Health   Financial Resource Strain:    Difficulty of Paying Living Expenses:   Food Insecurity:    Worried About Programme researcher, broadcasting/film/video in the Last Year:    Barista in the Last Year:   Transportation Needs:    Freight forwarder (Medical):    Lack of Transportation (Non-Medical):   Physical Activity:    Days of Exercise  per Week:    Minutes of Exercise per Session:   Stress:    Feeling of Stress :   Social Connections:    Frequency of Communication with Friends and Family:    Frequency of Social Gatherings with Friends and Family:    Attends Religious Services:    Active Member of Clubs or Organizations:    Attends Banker Meetings:    Marital Status:      Review of Systems: A 12 point ROS discussed and pertinent positives are indicated in the HPI above.  All other systems are negative.  Review of Systems  Constitutional: Negative for chills and fever.  Respiratory: Negative for shortness of breath and wheezing.   Cardiovascular: Negative for chest pain and palpitations.  Gastrointestinal: Negative for abdominal pain.  Neurological: Negative for headaches.  Psychiatric/Behavioral: Negative for behavioral problems and confusion.    Vital Signs: BP (!) 112/55 (BP Location: Left Arm)    Pulse 72    Temp 98.3 F (36.8 C) (Oral)    Resp 16    Ht 5\' 11"  (1.803 m)    Wt 147 lb 14.9 oz (67.1 kg)    SpO2 97%    BMI 20.63 kg/m   Physical Exam Vitals and nursing note reviewed.  Constitutional:      General: He is not in acute distress.    Appearance: Normal appearance.  Cardiovascular:     Rate and Rhythm: Normal rate and regular rhythm.     Heart sounds: Normal heart sounds. No murmur.  Pulmonary:     Effort: Pulmonary effort is normal. No respiratory distress.     Breath sounds: Normal breath sounds. No wheezing.  Skin:    General: Skin is warm and dry.  Neurological:     Mental Status: He is alert and oriented to person, place, and time.      MD Evaluation Airway: WNL Heart: WNL Abdomen: WNL Chest/ Lungs: WNL ASA  Classification: 3 Mallampati/Airway Score: Two   Imaging: EEG  Result Date: 03/05/2020 05/05/2020, MD     03/05/2020  4:55 PM Patient Name: Joe Peters MRN: [name] Attending: [id number] Referring Physician/Provider: Dr  Charlsie Quest Date: 03/05/2020 Duration: 23.38 mins Patient history: 86yo M who presented with dizziness, gait instability and slurred speech. EEG to evaluate for seizure Level of alertness: awake, drowsy AEDs during EEG study: Gabapentin Technical aspects: This EEG study was done with scalp electrodes positioned according to the 10-20 International system of electrode placement. Electrical activity was acquired at a sampling rate of 500Hz  and reviewed with a high frequency filter of 70Hz  and a low frequency filter of 1Hz . EEG data were recorded continuously and digitally stored. DESCRIPTION:: The posterior dominant rhythm consists of 8 Hz activity of moderate voltage (25-35 uV) seen predominantly in posterior head regions, symmetric and reactive to eye opening and eye closing.        Drowsiness was characterized by attenuation of the posterior background rhythm. Intermittent left temporal 2-3Hz  delta slowing was also noted. Hyperventilation and photic stimulation were not  performed. ABNORMALITY - Intermittent slow, left temporal region IMPRESSION: This study is suggestive of non specific left temporal dysfunction. No seizures or epileptiform discharges were seen throughout the recording. Priyanka Annabelle Harman   CT ANGIO HEAD W OR WO CONTRAST  Result Date: 03/04/2020 CLINICAL DATA:  Code stroke follow-up EXAM: CT ANGIOGRAPHY HEAD AND NECK TECHNIQUE: Multidetector CT imaging of the head and neck was performed using the standard protocol during bolus administration of intravenous contrast. Multiplanar CT image reconstructions and MIPs were obtained to evaluate the vascular anatomy. Carotid stenosis measurements (when applicable) are obtained utilizing NASCET criteria, using the distal internal carotid diameter as the denominator. CONTRAST:  75mL OMNIPAQUE IOHEXOL 350 MG/ML SOLN COMPARISON:  None. FINDINGS: CTA NECK FINDINGS Aortic arch: Calcified and noncalcified plaque along the arch. Great vessel origins are patent.  Circumferential plaque at the left common carotid origin causing less than 50% stenosis. Right carotid system: Patent. Minimal calcified plaque at the ICA origin without measurable stenosis. Left carotid system: Patent. Calcified and noncalcified plaque along the proximal internal carotid causing less than 50% stenosis. Vertebral arteries: Patent. Plaque at the right vertebral origin causes mild stenosis. There is focal severe stenosis of the left vertebral artery at the C3-C4 level. Otherwise mild multifocal irregularity related to cervical spine degenerative changes. Skeleton: Degenerative changes of the cervical spine Other neck: No mass or adenopathy. Upper chest: No apical lung mass. Review of the MIP images confirms the above findings CTA HEAD FINDINGS Anterior circulation: Intracranial internal carotid arteries are patent with mild calcified plaque. Anterior cerebral arteries are patent with anterior communicating artery present. Tandem moderate to marked stenoses of the right A2 ACA. Irregularity of the left A3 ACA with focal marked stenosis. Middle cerebral arteries are patent. Moderate to marked stenosis of the distal left M1 MCA extending into M2 branch with diminished flow. Additional multifocal bilateral distal MCA stenoses. Posterior circulation: Intracranial vertebral arteries, basilar artery, and posterior cerebral arteries are patent. Atherosclerotic irregularity of both posterior cerebral arteries with multiple moderate and marked stenoses of the P2 segments Venous sinuses: As permitted by contrast timing, patent. Review of the MIP images confirms the above findings IMPRESSION: No large vessel occlusion. Plaque at the proximal left ICA causing less than 50% stenosis. No measurable stenosis at the right ICA origin. Focal severe stenosis of the left V2 vertebral artery without apparent distal flow limitation. Significant intracranial atherosclerosis as detailed above involving anterior and posterior  circulations. Preliminary results were communicated to Dr. Amada Jupiter at 11:31 amon 5/10/2021by text page via the [facility] messaging system. Electronically Signed   By: Guadlupe Spanish M.D.   On: 03/04/2020 11:53   DG Chest 2 View  Result Date: 03/04/2020 CLINICAL DATA:  Slurred speech, dizziness EXAM: CHEST - 2 VIEW COMPARISON:  None. FINDINGS: Heart size is normal. Atherosclerotic calcification of the aortic knob. Prior descending aortic stent graft. There is a well-circumscribed 6.4 cm ovoid density adjacent to the mid descending thoracic aorta, which could potentially reflect an excluded aneurysm sac given the presence of the adjacent stent graft. No focal airspace consolidation, pleural effusion, or pneumothorax. No acute osseous findings. IMPRESSION: Smoothly marginated 6.4 cm ovoid density adjacent to the mid descending thoracic aorta, which could potentially reflect an excluded aneurysm sac given the presence of the adjacent stent graft. Further evaluation with contrast-enhanced chest CT is recommended if there are no available outside prior studies. Electronically Signed   By: Duanne Guess D.O.   On: 03/04/2020 13:50   CT HEAD WO CONTRAST  Result Date: 03/05/2020 CLINICAL DATA:  Follow-up stroke presentation. Dysarthria and facial droop. EXAM: CT HEAD WITHOUT CONTRAST TECHNIQUE: Contiguous axial images were obtained from the base of the skull through the vertex without intravenous contrast. COMPARISON:  CT studies 03/04/2020 FINDINGS: Brain: Generalized atrophy. No focal abnormality affects the brainstem or cerebellum. Cerebral hemispheres show small vessel change of the white matter. No cortical or large vessel territory infarction. No sign of acute infarction. No mass lesion, hemorrhage, hydrocephalus or extra-axial collection. Vascular: There is atherosclerotic calcification of the major vessels at the base of the brain. Skull: Negative Sinuses/Orbits: Clear/normal Other: None IMPRESSION: No  change since yesterday. Brain atrophy. Chronic small-vessel change of the hemispheric white matter. No sign of acute or subacute infarction by CT. Electronically Signed   By: Paulina Fusi M.D.   On: 03/05/2020 12:52   CT ANGIO NECK W OR WO CONTRAST  Result Date: 03/04/2020 CLINICAL DATA:  Code stroke follow-up EXAM: CT ANGIOGRAPHY HEAD AND NECK TECHNIQUE: Multidetector CT imaging of the head and neck was performed using the standard protocol during bolus administration of intravenous contrast. Multiplanar CT image reconstructions and MIPs were obtained to evaluate the vascular anatomy. Carotid stenosis measurements (when applicable) are obtained utilizing NASCET criteria, using the distal internal carotid diameter as the denominator. CONTRAST:  35mL OMNIPAQUE IOHEXOL 350 MG/ML SOLN COMPARISON:  None. FINDINGS: CTA NECK FINDINGS Aortic arch: Calcified and noncalcified plaque along the arch. Great vessel origins are patent. Circumferential plaque at the left common carotid origin causing less than 50% stenosis. Right carotid system: Patent. Minimal calcified plaque at the ICA origin without measurable stenosis. Left carotid system: Patent. Calcified and noncalcified plaque along the proximal internal carotid causing less than 50% stenosis. Vertebral arteries: Patent. Plaque at the right vertebral origin causes mild stenosis. There is focal severe stenosis of the left vertebral artery at the C3-C4 level. Otherwise mild multifocal irregularity related to cervical spine degenerative changes. Skeleton: Degenerative changes of the cervical spine Other neck: No mass or adenopathy. Upper chest: No apical lung mass. Review of the MIP images confirms the above findings CTA HEAD FINDINGS Anterior circulation: Intracranial internal carotid arteries are patent with mild calcified plaque. Anterior cerebral arteries are patent with anterior communicating artery present. Tandem moderate to marked stenoses of the right A2 ACA.  Irregularity of the left A3 ACA with focal marked stenosis. Middle cerebral arteries are patent. Moderate to marked stenosis of the distal left M1 MCA extending into M2 branch with diminished flow. Additional multifocal bilateral distal MCA stenoses. Posterior circulation: Intracranial vertebral arteries, basilar artery, and posterior cerebral arteries are patent. Atherosclerotic irregularity of both posterior cerebral arteries with multiple moderate and marked stenoses of the P2 segments Venous sinuses: As permitted by contrast timing, patent. Review of the MIP images confirms the above findings IMPRESSION: No large vessel occlusion. Plaque at the proximal left ICA causing less than 50% stenosis. No measurable stenosis at the right ICA origin. Focal severe stenosis of the left V2 vertebral artery without apparent distal flow limitation. Significant intracranial atherosclerosis as detailed above involving anterior and posterior circulations. Preliminary results were communicated to Dr. Amada Jupiter at 11:31 amon 5/10/2021by text page via the [facility] messaging system. Electronically Signed   By: Guadlupe Spanish M.D.   On: 03/04/2020 11:53   ECHOCARDIOGRAM COMPLETE  Result Date: 03/04/2020    ECHOCARDIOGRAM REPORT   Patient Name:   DUNCAN ALEJANDRO Date of Exam: 03/04/2020 Medical Rec #:  [id number]        Height:  70.0 in Accession #:    [id number]       Weight:       147.9 lb Date of Birth:  October 05, 1928        BSA:          1.836 m Patient Age:    86 years         BP:           128/64 mmHg Patient Gender: M                HR:           70 bpm. Exam Location:  Inpatient Procedure: 2D Echo, Cardiac Doppler and Color Doppler Indications:    TIA 435.96 / G45.9  History:        Patient has no prior history of Echocardiogram examinations.  Sonographer:    Elmarie Shiley Dance Referring Phys: [id number] Emeline General IMPRESSIONS  1. Left ventricular ejection fraction, by estimation, is 55 to 60%. The left ventricle has normal  function. The left ventricle has no regional wall motion abnormalities. There is mild left ventricular hypertrophy. Left ventricular diastolic parameters are indeterminate.  2. Right ventricular systolic function is normal. The right ventricular size is normal. There is normal pulmonary artery systolic pressure. The estimated right ventricular systolic pressure is 24.9 mmHg.  3. The mitral valve is normal in structure. Trivial mitral valve regurgitation.  4. The tricuspid valve is abnormal. Thickened leaflets. Mild regurgitation.  5. The aortic valve is tricuspid. Aortic valve regurgitation is mild to moderate. No aortic stenosis is present.  6. The inferior vena cava is normal in size with greater than 50% respiratory variability, suggesting right atrial pressure of 3 mmHg.  7. Aortic dilatation noted. Aneurysm of the aortic root, measuring 47 mm. FINDINGS  Left Ventricle: Left ventricular ejection fraction, by estimation, is 55 to 60%. The left ventricle has normal function. The left ventricle has no regional wall motion abnormalities. The left ventricular internal cavity size was normal in size. There is  mild left ventricular hypertrophy. Left ventricular diastolic parameters are indeterminate. Right Ventricle: The right ventricular size is normal. Right vetricular wall thickness was not assessed. Right ventricular systolic function is normal. There is normal pulmonary artery systolic pressure. The tricuspid regurgitant velocity is 2.34 m/s, and with an assumed right atrial pressure of 3 mmHg, the estimated right ventricular systolic pressure is 24.9 mmHg. Left Atrium: Left atrial size was normal in size. Right Atrium: Right atrial size was normal in size. Pericardium: Trivial pericardial effusion is present. Mitral Valve: The mitral valve is normal in structure. Trivial mitral valve regurgitation. Tricuspid Valve: Thickened leaflets. The tricuspid valve is abnormal. Tricuspid valve regurgitation is mild. Aortic  Valve: The aortic valve is tricuspid. Aortic valve regurgitation is mild to moderate. Aortic regurgitation PHT measures 473 msec. No aortic stenosis is present. Pulmonic Valve: The pulmonic valve was not well visualized. Pulmonic valve regurgitation is mild. Aorta: Aortic dilatation noted. There is an aneurysm involving the aortic root. The aneurysm measures 47 mm. Venous: The inferior vena cava is normal in size with greater than 50% respiratory variability, suggesting right atrial pressure of 3 mmHg. IAS/Shunts: The interatrial septum was not well visualized.  LEFT VENTRICLE PLAX 2D LVIDd:         4.20 cm LVIDs:         3.40 cm LV PW:         0.90 cm LV IVS:        1.00 cm  LVOT diam:     2.10 cm LV SV:         57 LV SV Index:   31 LVOT Area:     3.46 cm  RIGHT VENTRICLE             IVC RV Basal diam:  3.40 cm     IVC diam: 1.70 cm RV Mid diam:    2.10 cm RV S prime:     10.40 cm/s TAPSE (M-mode): 2.0 cm LEFT ATRIUM             Index       RIGHT ATRIUM           Index LA diam:        2.40 cm 1.31 cm/m  RA Area:     18.40 cm LA Vol (A2C):   39.5 ml 21.51 ml/m RA Volume:   48.90 ml  26.63 ml/m LA Vol (A4C):   50.5 ml 27.50 ml/m LA Biplane Vol: 44.2 ml 24.07 ml/m  AORTIC VALVE LVOT Vmax:   93.60 cm/s LVOT Vmean:  57.350 cm/s LVOT VTI:    0.164 m AI PHT:      473 msec  AORTA Ao Root diam: 4.70 cm Ao Asc diam:  3.40 cm MITRAL VALVE               TRICUSPID VALVE MV Area (PHT): 2.62 cm    TR Peak grad:   21.9 mmHg MV Decel Time: 289 msec    TR Vmax:        234.00 cm/s MV E velocity: 0.51 cm/s MV A velocity: 72.30 cm/s  SHUNTS MV E/A ratio:  0.01        Systemic VTI:  0.16 m                            Systemic Diam: 2.10 cm Epifanio Lesches MD Electronically signed by Epifanio Lesches MD Signature Date/Time: 03/04/2020/9:18:28 PM    Final    CT HEAD CODE STROKE WO CONTRAST  Result Date: 03/04/2020 CLINICAL DATA:  Code stroke.  Dysarthria, facial droop EXAM: CT HEAD WITHOUT CONTRAST TECHNIQUE: Contiguous  axial images were obtained from the base of the skull through the vertex without intravenous contrast. COMPARISON:  None. FINDINGS: Brain: There is no acute intracranial hemorrhage, mass effect, or edema. Gray-white differentiation is preserved. Prominence of the ventricles and sulci reflects generalized parenchymal volume loss. There is no extra-axial fluid collection. Patchy hypoattenuation in the supratentorial white matter is nonspecific but may reflect mild chronic microvascular ischemic changes. Vascular: No hyperdense vessel. There is intracranial atherosclerotic calcification at the skull base. Skull: Unremarkable Sinuses/Orbits: . minor mucosal thickening. Orbits are unremarkable. Other: Mastoid air cells are clear. ASPECTS ([place] Stroke Program Early CT Score) - Ganglionic level infarction (caudate, lentiform nuclei, internal capsule, insula, M1-M3 cortex): 7 - Supraganglionic infarction (M4-M6 cortex): 3 Total score (0-10 with 10 being normal): 10 IMPRESSION: No acute intracranial hemorrhage or evidence of acute infarction. ASPECT score is 10. Chronic microvascular ischemic changes. These results were communicated to Dr. Amada Jupiter at 11:21 amon 5/10/2021by text page via the [facility] messaging system. Electronically Signed   By: Guadlupe Spanish M.D.   On: 03/04/2020 11:23    Labs:  CBC: Recent Labs    03/04/20 1110 03/04/20 1112  WBC 7.1  --   HGB 12.9* 12.9*  HCT 39.0 38.0*  PLT 208  --     COAGS: Recent Labs  03/04/20 1110  INR 1.1  APTT 27    BMP: Recent Labs    03/04/20 1110 03/04/20 1112 03/05/20 0253 03/06/20 0729  NA 130* 128* 132* 133*  K 4.2 4.1 3.7 3.8  CL 94* 92* 100 101  CO2 23  --  26 27  GLUCOSE 259* 252* 100* 94  BUN 18 20 16 17   CALCIUM 9.5  --  8.6* 8.6*  CREATININE 1.52* 1.50* 1.35* 1.37*  GFRNONAA 40*  --  46* 45*  GFRAA 46*  --  54* 53*    LIVER FUNCTION TESTS: Recent Labs    03/04/20 1110  BILITOT 1.3*  AST 17  ALT 13  ALKPHOS 36*    PROT 5.9*  ALBUMIN 3.4*     Assessment and Plan:  Near syncope without known etiology. Plan for image-guided diagnostic cerebral arteriogram tentatively for tomorrow 03/07/2020 in IR with Dr. Karenann Cai. Patient will be NPO at midnight. Afebrile. Ok to proceed with Pletal per Dr. Karenann Cai, will hold Heparin per IR protocol. INR 1.1 03/04/2020.  Risks and benefits of diagnostic cerebral arteriogram were discussed with the patient including, but not limited to bleeding, infection, vascular injury, stroke, or contrast induced renal failure. This interventional procedure involves the use of X-rays and because of the nature of the planned procedure, it is possible that we will have prolonged use of X-ray fluoroscopy. Potential radiation risks to you include (but are not limited to) the following: - A slightly elevated risk for cancer  several years later in life. This risk is typically less than 0.5% percent. This risk is low in comparison to the normal incidence of human cancer, which is 33% for women and 50% for men according to the [employer]. - Radiation induced injury can include skin redness, resembling a rash, tissue breakdown / ulcers and hair loss (which can be temporary or permanent).  The likelihood of either of these occurring depends on the difficulty of the procedure and whether you are sensitive to radiation due to previous procedures, disease, or genetic conditions.  IF your procedure requires a prolonged use of radiation, you will be notified and given written instructions for further action.  It is your responsibility to monitor the irradiated area for the 2 weeks following the procedure and to notify your physician if you are concerned that you have suffered a radiation induced injury.   All of the patient's questions were answered and patient is agreeable to proceed as long as his daughter agrees with procedure and signs consent. Consent obtained  by patient's daughter, Fransico Michael, via telephone- signed and in IR control room.   Thank you for this interesting consult.  I greatly enjoyed meeting Wilburt Messina and look forward to participating in their care.  A copy of this report was sent to the requesting provider on this date.  Electronically Signed: Earley Abide, PA-C 03/06/2020, 3:36 PM   I spent a total of 40 Minutes in face to face in clinical consultation, greater than 50% of which was counseling/coordinating care for near syncope/diagnostic cerebral arteriogram.

## 2020-03-06 NOTE — TOC Progression Note (Signed)
Transition of Care [facility]) - Progression Note    Patient Details  Name: Joe Peters MRN: [id number] Date of Birth: [birth year]  Transition of Care [facility]) CM/SW Contact  Terrilee Croak, Student-Social Work Phone Number: 03/06/2020, 12:30 PM  Clinical Narrative:    MSW Intern spoke with pt's daughter to discuss discharge planning. She noted that she would like to keep pt in [facility] if at all possible and is working with them to try to get him back. [facility] contacted SW to note that they are following as well. Did not give bed offers, due to daughter following with [facility] and not wanting them at the moment. SW will continue to follow.   Expected Discharge Plan: Skilled Nursing Facility Barriers to Discharge: Continued Medical Work up  Expected Discharge Plan and Services Expected Discharge Plan: Skilled Nursing Facility In-house Referral: Clinical Social Work Discharge Planning Services: CM Consult Post Acute Care Choice: Skilled Nursing Facility Living arrangements for the past 2 months: Apartment                                       Social Determinants of Health (SDOH) Interventions    Readmission Risk Interventions No flowsheet data found.

## 2020-03-06 NOTE — Progress Notes (Signed)
EEG complete - results pending 

## 2020-03-06 NOTE — Progress Notes (Signed)
STROKE TEAM PROGRESS NOTE   INTERVAL HISTORY Neurologically stable overnight.  No changes.  CT scan of the head repeated yesterday shows no acute abnormality.  MRI not done because of metal in his foot.  I spoke to the patient daughter over the phone who informed me that he has had multiple daily episodes when he stands up his face becomes pale and he is staring briefly unresponsive and tends to fall down.  She is never noticed any lipsmacking or tongue bite but yesterday he had an episode in which the nurse witness fluttering of his eyelids and some trembling of his legs when he stood up.  Immediate blood pressure dropped was not documented.  CT angiogram shows bilateral calcification at the carotid bifurcation with only 50% stenosis but there is mild right vertebral artery origin as well as severe left V3-4 stenosis of vertebral artery.  There has been no witnessed tonic-clonic activity or tongue bite or incontinence noted. EEG done yesterday shows focal 3 to 4 Hz left temporal delta slowing but no spikes or sharp waves or seizure activity Vitals:   03/06/20 0000 03/06/20 0340 03/06/20 0815 03/06/20 1131  BP:  115/68 121/65 (!) 112/55  Pulse:  79 63 72  Resp: 18 18 20 16   Temp:  97.7 F (36.5 C) 98.1 F (36.7 C) 98.3 F (36.8 C)  TempSrc:  Axillary Oral Oral  SpO2:  96% 97% 97%  Weight:      Height:        CBC:  Recent Labs  Lab 03/04/20 1110 03/04/20 1112  WBC 7.1  --   NEUTROABS 4.7  --   HGB 12.9* 12.9*  HCT 39.0 38.0*  MCV 94.2  --   PLT 208  --     Basic Metabolic Panel:  Recent Labs  Lab 03/05/20 0253 03/06/20 0729  NA 132* 133*  K 3.7 3.8  CL 100 101  CO2 26 27  GLUCOSE 100* 94  BUN 16 17  CREATININE 1.35* 1.37*  CALCIUM 8.6* 8.6*   Lipid Panel:     Component Value Date/Time   CHOL 107 03/05/2020 0253   TRIG 47 03/05/2020 0253   HDL 50 03/05/2020 0253   CHOLHDL 2.1 03/05/2020 0253   VLDL 9 03/05/2020 0253   LDLCALC 48 03/05/2020 0253   HgbA1c:  Lab  Results  Component Value Date   HGBA1C 7.5 (H) 03/05/2020    Alcohol Level     Component Value Date/Time   ETH <10 03/04/2020 1110    IMAGING past 24 hours EEG  Result Date: 03/05/2020 05/05/2020, MD     03/05/2020  4:55 PM Patient Name: Joe Peters MRN: [facility] Attending: [id number] Referring Physician/Provider: Dr Charlsie Quest Date: 03/05/2020 Duration: 23.38 mins Patient history: 86yo M who presented with dizziness, gait instability and slurred speech. EEG to evaluate for seizure Level of alertness: awake, drowsy AEDs during EEG study: Gabapentin Technical aspects: This EEG study was done with scalp electrodes positioned according to the 10-20 International system of electrode placement. Electrical activity was acquired at a sampling rate of 500Hz  and reviewed with a high frequency filter of 70Hz  and a low frequency filter of 1Hz . EEG data were recorded continuously and digitally stored. DESCRIPTION:: The posterior dominant rhythm consists of 8 Hz activity of moderate voltage (25-35 uV) seen predominantly in posterior head regions, symmetric and reactive to eye opening and eye closing.        Drowsiness was characterized by attenuation of the  posterior background rhythm. Intermittent left temporal 2-3Hz  delta slowing was also noted. Hyperventilation and photic stimulation were not performed. ABNORMALITY - Intermittent slow, left temporal region IMPRESSION: This study is suggestive of non specific left temporal dysfunction. No seizures or epileptiform discharges were seen throughout the recording. Priyanka Annabelle Harman    PHYSICAL EXAM Pleasant elderly Caucasian male not in distress.  He is has decreased hearing bilaterally. . Afebrile. Head is nontraumatic. Neck is supple without bruit.    Cardiac exam no murmur or gallop. Lungs are clear to auscultation. Distal pulses are well felt. Neurological Exam ;  Awake  Alert oriented x 3. Normal speech and language.  Diminished  attention, registration and recall..Eye movements full without nystagmus.fundi were not visualized. Vision acuity and fields appear normal. Hearing is normal. Palatal movements are normal. Face symmetric. Tongue midline. Normal strength, tone, reflexes and coordination. Normal sensation. Gait deferred.  ASSESSMENT/PLAN Joe Peters is a 86 y.o. male with history of diabetes, BPH, hypothyroidism, urinary retention, aortic aneurysm with endovascular stent graft, depression, hypercholesterolemia, dementia and possible TIAs in the past presenting with dizziness, gait instability and slurred speech.   Possible complex partial seizure vs hemodynamic TIA versus near syncope  CT head No acute abnormality. Small vessel disease. Atrophy. ASPECTS 10.     CTA head & neck no LVO. Proximal L ICA < 50% stenosis. L V2 focal severe stenosis. Significant atherosclerosis throughout.   Repeat CT at 24h no change  2D Echo EF 55-60%. No source of embolus   EEG focal left temporal 3 to 4 Hz slowing  LDL 48  HgbA1c 7.5  Heparin 5000 units sq tid for VTE prophylaxis  pletal prior to admission, now on aspirin 81 mg daily and pletal. No indication for additional aspirin.  Will d/c. Continue pletal at d/c.   Therapy recommendations:  SNF (currently at ALF level, may be able to provide appropriate care)  Disposition:  pending   Hypertension  Stable . BP goal normotensive  Hyperlipidemia  Home meds:  zocor 40  Now on lipitor 80  LDL 48, at goal < 70  Ok to continue home statin, continue at discharge  Diabetes type II Uncontrolled  HgbA1c 7.5, goal < 7.0  Other Stroke Risk Factors  Advanced age  Hx AAA s/p stent graft on pletal  Hx TIA per hx, no documentation in EPIC  Other Active Problems  CKD  hypothyroid on synthroid   Hospital day # 1  Patient presented with recurrent episodes episodes of dizziness and gait ataxia and 2 falls without definite loss of consciousness these  are apparently all been in the upright position but there has been no definite documentation of orthostatic hypotension..    Patient cannot have an MRI as he has a piece of metal in his right leg from the [place] War which may not be MRI compatible.  Recommend repeat EEG and empirical trial of Keppra given eyewitness account by nurse yesterday could suggest complex partial seizure and EEG focal abnormality which further supported.  Also recommend check diagnostic cerebral catheter angiogram to rule out any hemodynamically significant stenosis which is causing limb shaking TIAs recommend aspirin 81 mg daily and Pletal and aggressive risk factor modification.  I spoke to the patient's daughter over the phone and answered questions.  Discussed with Dr. Margo Aye.  Greater than 50% time during the 25-minute visit was spent on counseling and coordination of care about his dizziness and possible TIA episode and answering Delia Heady, MD Medical Director Patrcia Dolly  [facility] Stroke [facility] Pager: [phone] 03/06/2020 12:50 PMTo contact Stroke Continuity provider, please refer to [email or web address] After hours, contact General Neurology

## 2020-03-06 NOTE — Progress Notes (Signed)
PROGRESS NOTE  Joe Peters PNT:[id number] DOB: 1928-05-10 DOA: 03/04/2020 PCP: Martha Clan, MD  HPI/Recap of past 24 hours:  Joe Peters is a 86 y.o. male with medical history significant of HTN, IDDM, PVD, diabetic neuropathy, AAA rupture status post repair, HLD, hypothyroidism, brought in for code stroke.  Patient was at assisted living facility, has chronic ambulation dysfunction using a walker intermittently.    The morning of presentation during a regular physical therapy, staff noticed patient suddenly stopped walking and became very wobbly and about to fall, meantime seemed was unable to verbalize for few minutes. They were able to hold him and no fall happened. Patient remembered as he felt like he was about to fall, and he wanted to ask for help but could not talk despite normal understanding and forming ideas in his mind. EMS on route noticed patient has intermittent slurred speech.  Patient denies any lightheaded or blurred vision, no chest pain or palpitations during this episode.  He recently moved from [place] to join his daughter who works as a Engineer, civil (consulting) at [employer].  ED Course: Patient is a [place] War veteran, has broken bone and repaired with some unknown metals in his legs in 1950s. MRI unable to perform. CTA: No large vessel occlusion. Focal severe stenosis of the left V2 vertebral artery without apparent distal flow limitation.  Significant intracranial atherosclerosis.  CT head:No acute intracranial hemorrhage or evidence of acute infarction.  5/11 AM  when working with PT OT became minimally responsive with eyes flickering, lasting about 15 seconds.  EEG ordered to assess for possible seizure, no epileptiform activity but showed focal left temporal slowing.  Neurology following.  03/06/20: Seen and examined.  He is alert oriented x3.  He denies any headache or change in his vision.  No focal weaknesses.  Discussed with neurology Dr. Pearlean Brownie, ongoing  work-up  Assessment/Plan: Active Problems:   TIA (transient ischemic attack)   HTN (hypertension)   DM (diabetes mellitus), secondary, uncontrolled, with neurologic complications (HCC)   TIA with dysarthria and probably ataxia -Unable to perform MRI -No large vessel occlusion on CTA head and neck. -Focal severe stenosis of the left V2 vertebral artery without apparent distal flow limitation. -Dysarthria-resolved  -Continue Pletal and Zocor  Suspected seizure activity/possible complex partial seizure -when working with PT OT became minimally responsive with eyes flickering, lasting about 15 seconds.  Eyes flickering have also been witnessed by his daughter who states they are becoming more frequent. EEG 5/11 showed left temporal slowing  Seen by neurology, discussed with Dr. Pearlean Brownie, starting empirical trial of Keppra Seizure precautions Continue to closely monitor  Severe intracranial atherosclerosis Findings as stated above Continue aspirin, Lipitor, Pletal  Hypovolemic hyponatremia, improved with normal saline IV hydration -Likely from HCTZ, hold BP meds for now and allow permissive hypertension -Received normal saline at 75 cc/h -Serum Na+ trending up 133 from 128  HTN -Allow for permissive HTN -As needed hydralazine for now for BP more than 220/120   CKD IIIA Baseline cr 1.3 GFR 46 Continue to avoid nephrotoxins and hypotension  HLD C/w High intensity statin, Zocor 40 mg daily Continue fenofibrate  IDDM2 with hyperglycemia -a1C 7.5 on 03/05/20 Hold off oral hypoglycemics Continue Lantus 5 units daily and insulin sliding scale. Avoid hypoglycemia  Hypothyroid Add on TSH -On Synthroid  BPH Continue tamsulosin Monitor urine output  Chronic anxiety/depression- Stable Continue Zoloft  DVT prophylaxis: Heparin subcu TID Code Status: Full code Family Communication: None at bedside  Consults called:  Neurology    Dispo: The patient is from:   ALF                        Anticipated d/c is to: ALF              Anticipated d/c date is: 03/07/20, or when Neurology signs off.              Patient currently undergoing work up for neurological changes.        Objective: Vitals:   03/06/20 0000 03/06/20 0340 03/06/20 0815 03/06/20 1131  BP:  115/68 121/65 (!) 112/55  Pulse:  79 63 72  Resp: 18 18 20 16   Temp:  97.7 F (36.5 C) 98.1 F (36.7 C) 98.3 F (36.8 C)  TempSrc:  Axillary Oral Oral  SpO2:  96% 97% 97%  Weight:      Height:        Intake/Output Summary (Last 24 hours) at 03/06/2020 1447 Last data filed at 03/06/2020 1015 Gross per 24 hour  Intake 381.68 ml  Output 708 ml  Net -326.32 ml   Filed Weights   03/04/20 1100  Weight: 67.1 kg    Exam:  . General: 86 y.o. year-old male well-developed well-nourished in no acute distress.  Alert and oriented x3.  .  Cardiovascular: Regular rate and rhythm no rubs or gallops. Marland Kitchen Respiratory: Clear to auscultation no wheezes or rales.   . Abdomen: Soft nontender normal bowel sounds present  . musculoskeletal: No lower extremity edema bilaterally.   Marland Kitchen Psychiatry: Mood is appropriate for condition and setting.  Data Reviewed: CBC: Recent Labs  Lab 03/04/20 1110 03/04/20 1112  WBC 7.1  --   NEUTROABS 4.7  --   HGB 12.9* 12.9*  HCT 39.0 38.0*  MCV 94.2  --   PLT 208  --    Basic Metabolic Panel: Recent Labs  Lab 03/04/20 1110 03/04/20 1112 03/05/20 0253 03/06/20 0729  NA 130* 128* 132* 133*  K 4.2 4.1 3.7 3.8  CL 94* 92* 100 101  CO2 23  --  26 27  GLUCOSE 259* 252* 100* 94  BUN 18 20 16 17   CREATININE 1.52* 1.50* 1.35* 1.37*  CALCIUM 9.5  --  8.6* 8.6*   GFR: Estimated Creatinine Clearance: 34.7 mL/min (A) (by C-G formula based on SCr of 1.37 mg/dL (H)). Liver Function Tests: Recent Labs  Lab 03/04/20 1110  AST 17  ALT 13  ALKPHOS 36*  BILITOT 1.3*  PROT 5.9*  ALBUMIN 3.4*   No results for input(s): LIPASE, AMYLASE in the last 168 hours. No  results for input(s): AMMONIA in the last 168 hours. Coagulation Profile: Recent Labs  Lab 03/04/20 1110  INR 1.1   Cardiac Enzymes: No results for input(s): CKTOTAL, CKMB, CKMBINDEX, TROPONINI in the last 168 hours. BNP (last 3 results) No results for input(s): PROBNP in the last 8760 hours. HbA1C: Recent Labs    03/05/20 0253  HGBA1C 7.5*   CBG: Recent Labs  Lab 03/05/20 1238 03/05/20 1640 03/05/20 2202 03/06/20 0624 03/06/20 1129  GLUCAP 160* 159* 156* 92 164*   Lipid Profile: Recent Labs    03/05/20 0253  CHOL 107  HDL 50  LDLCALC 48  TRIG 47  CHOLHDL 2.1   Thyroid Function Tests: No results for input(s): TSH, T4TOTAL, FREET4, T3FREE, THYROIDAB in the last 72 hours. Anemia Panel: No results for input(s): VITAMINB12, FOLATE, FERRITIN, TIBC, IRON, RETICCTPCT in the last 72 hours.  Urine analysis: No results found for: COLORURINE, APPEARANCEUR, LABSPEC, PHURINE, GLUCOSEU, HGBUR, BILIRUBINUR, KETONESUR, PROTEINUR, UROBILINOGEN, NITRITE, LEUKOCYTESUR Sepsis Labs: @LABRCNTIP (procalcitonin:4,lacticidven:4)  ) Recent Results (from the past 240 hour(s))  SARS Coronavirus 2 by RT PCR (hospital order, performed in [facility] hospital lab) Nasopharyngeal Nasopharyngeal Swab     Status: None   Collection Time: 03/04/20  4:00 PM   Specimen: Nasopharyngeal Swab  Result Value Ref Range Status   SARS Coronavirus 2 NEGATIVE NEGATIVE Final    Comment: (NOTE) SARS-CoV-2 target nucleic acids are NOT DETECTED. The SARS-CoV-2 RNA is generally detectable in upper and lower respiratory specimens during the acute phase of infection. The lowest concentration of SARS-CoV-2 viral copies this assay can detect is 250 copies / mL. A negative result does not preclude SARS-CoV-2 infection and should not be used as the sole basis for treatment or other patient management decisions.  A negative result may occur with improper specimen collection / handling, submission of specimen  other than nasopharyngeal swab, presence of viral mutation(s) within the areas targeted by this assay, and inadequate number of viral copies (<250 copies / mL). A negative result must be combined with clinical observations, patient history, and epidemiological information. Fact Sheet for Patients:   05/04/20 Fact Sheet for Healthcare Providers: BoilerBrush.com.cy This test is not yet approved or cleared  by the https://pope.com/ FDA and has been authorized for detection and/or diagnosis of SARS-CoV-2 by FDA under an Emergency Use Authorization (EUA).  This EUA will remain in effect (meaning this test can be used) for the duration of the COVID-19 declaration under Section 564(b)(1) of the Act, 21 U.S.C. section 360bbb-3(b)(1), unless the authorization is terminated or revoked sooner. Performed at [facility], [address] [place], [place]       Studies: EEG  Result Date: 03/05/2020 05/05/2020, MD     03/05/2020  4:55 PM Patient Name: Kylo Gavin MRN: [facility] Epile[facility] Attending: [id number] Referring Physician/Provider: Dr Charlsie Quest Date: 03/05/2020 Duration: 23.38 mins Patient history: 86yo M who presented with dizziness, gait instability and slurred speech. EEG to evaluate for seizure Level of alertness: awake, drowsy AEDs during EEG study: Gabapentin Technical aspects: This EEG study was done with scalp electrodes positioned according to the 10-20 International system of electrode placement. Electrical activity was acquired at a sampling rate of 500Hz  and reviewed with a high frequency filter of 70Hz  and a low frequency filter of 1Hz . EEG data were recorded continuously and digitally stored. DESCRIPTION:: The posterior dominant rhythm consists of 8 Hz activity of moderate voltage (25-35 uV) seen predominantly in posterior head regions, symmetric and reactive to eye opening and eye closing.         Drowsiness was characterized by attenuation of the posterior background rhythm. Intermittent left temporal 2-3Hz  delta slowing was also noted. Hyperventilation and photic stimulation were not performed. ABNORMALITY - Intermittent slow, left temporal region IMPRESSION: This study is suggestive of non specific left temporal dysfunction. No seizures or epileptiform discharges were seen throughout the recording. Priyanka    Scheduled Meds: . cilostazol  50 mg Oral BID  . donepezil  10 mg Oral QHS  . fenofibrate  54 mg Oral Daily  . gabapentin  100 mg Oral BID  . heparin  5,000 Units Subcutaneous Q12H  . insulin aspart  0-9 Units Subcutaneous TID WC  . insulin glargine  5 Units Subcutaneous QHS  . levETIRAcetam  500 mg Oral Daily  . levothyroxine  50  mcg Oral QAC breakfast  . sertraline  50 mg Oral Daily  . simvastatin  40 mg Oral q1800  . tamsulosin  0.4 mg Oral Daily    Continuous Infusions:    LOS: 1 day     Darlin Drop, MD [facility] Pager [phone]  If 7PM-7AM, please contact night-coverage [email or web address] Password [facility] 03/06/2020, 2:47 PM

## 2020-03-06 NOTE — Progress Notes (Signed)
Physical Therapy Treatment Patient Details Name: Joe Peters MRN: [id number] DOB: [birth year] Today's Date: 03/06/2020    History of Present Illness Pt is 86 yo male with PMH including HTN, hypelipidemia, PVD, ruptured AAA s/p repair, DM, neuropathy, and hypothyroidism. Pt came to ED with near syncopal episode with associated difficulty with word finding.  Pt with EEG that showed L temporal slowing but no indications of seizure activity.  CT head revealed no acute abnormality, pt unable to have MRI.  Pt with possible complex partial seizure vs hemodynamic TIA vs near syncope per neuro note.  Pt did have orthostatic hypotension earlier today 5/12 and scheduled for possible cerebral artirogram to evaluate vascular cause of syncope.    PT Comments    Pt remains limited due to orthostatic hypotension.  Pt was only able to tolerate chair to bed transfer and unable to tolerate 3 mins of standing with therapy.  Required min A of 2 for safety.  Performed seated ther ex prior to attempts to stand. Cont to advance as able.      BP as follows: Seated 110/55 and HR 75 Standing 84/44 and HR 80 Standing 3 mins: pt with increasing posterior lean and decreasing communication so returned to sitting Sitting 126/42 and HR 73     Follow Up Recommendations  SNF;Supervision/Assistance - 24 hour(Family looking into if ALF could provide temporary increased supprot per notes)     Equipment Recommendations  None recommended by PT    Recommendations for Other Services       Precautions / Restrictions Precautions Precautions: Fall Precaution Comments: Watch BP    Mobility  Bed Mobility Overal bed mobility: Needs Assistance Bed Mobility: Sit to Supine       Sit to supine: Min assist   General bed mobility comments: cues for safety; in chair at arrival  Transfers Overall transfer level: Needs assistance Equipment used: 2 person hand held assist Transfers: Sit to/from Stand;Stand Pivot  Transfers Sit to Stand: +2 safety/equipment;Min assist Stand pivot transfers: +2 safety/equipment;Min assist       General transfer comment: Verbal and tactile cues for safe hand placement and sequencing; tended to have posterior lean - cued for posture.  Performed sit to stand x 2 (for transfer back to bed and to scoot up at EOB)  Ambulation/Gait Ambulation/Gait assistance: Min assist;+2 safety/equipment Gait Distance (Feet): 2 Feet Assistive device: 2 person hand held assist Gait Pattern/deviations: Decreased stride length;Shuffle Gait velocity: decreased   General Gait Details: steps back to bed; posterior lean; unsteady; tactile cues for posture; limited further due to orthostatic hypotension   Stairs             Wheelchair Mobility    Modified Rankin (Stroke Patients Only)       Balance Overall balance assessment: Needs assistance Sitting-balance support: Feet supported;No upper extremity supported Sitting balance-Leahy Scale: Fair     Standing balance support: Single extremity supported Standing balance-Leahy Scale: Poor Standing balance comment: Posterior lean; mod A initially but able to progress to min A on 2nd rep.  Limited tolerance due to orthostatic hypotension                            Cognition Arousal/Alertness: Awake/alert Behavior During Therapy: WFL for tasks assessed/performed Overall Cognitive Status: No family/caregiver present to determine baseline cognitive functioning  Exercises General Exercises - Lower Extremity Ankle Circles/Pumps: AROM;Both;10 reps;Seated Long Arc Quad: AROM;10 reps;Both;Seated Hip Flexion/Marching: AROM;Both;10 reps;Seated    General Comments   BP as follows: Seated 110/55 and HR 75 Standing 84/44 and HR 80 Standing 3 mins: pt with increasing posterior lean and decreasing communication so returned to sitting Sitting 126/42 and HR 73      Pertinent Vitals/Pain Pain Assessment: No/denies pain    Home Living                      Prior Function            PT Goals (current goals can now be found in the care plan section) Progress towards PT goals: Progressing toward goals    Frequency    Min 3X/week      PT Plan Current plan remains appropriate    Co-evaluation              AM-PAC PT "6 Clicks" Mobility   Outcome Measure  Help needed turning from your back to your side while in a flat bed without using bedrails?: A Little Help needed moving from lying on your back to sitting on the side of a flat bed without using bedrails?: A Little Help needed moving to and from a bed to a chair (including a wheelchair)?: A Little Help needed standing up from a chair using your arms (e.g., wheelchair or bedside chair)?: A Little Help needed to walk in hospital room?: A Lot Help needed climbing 3-5 steps with a railing? : A Lot 6 Click Score: 16    End of Session Equipment Utilized During Treatment: Gait belt Activity Tolerance: Treatment limited secondary to medical complications (Comment) Patient left: with call bell/phone within reach;in bed;with bed alarm set Nurse Communication: Mobility status;Other (comment)(back in bed; still with orthostatic hypotension) PT Visit Diagnosis: Unsteadiness on feet (R26.81);Repeated falls (R29.6);Difficulty in walking, not elsewhere classified (R26.2)     Time: 0347-4259 PT Time Calculation (min) (ACUTE ONLY): 16 min  Charges:  $Therapeutic Activity: 8-22 mins                     Maggie Font, PT Acute Rehab Services Pager [phone] [facility] [phone] [facility] [place] 03/06/2020, 4:48 PM

## 2020-03-07 ENCOUNTER — Inpatient Hospital Stay (HOSPITAL_COMMUNITY): Payer: Medicare Other

## 2020-03-07 DIAGNOSIS — R55 Syncope and collapse: Secondary | ICD-10-CM

## 2020-03-07 HISTORY — PX: IR ANGIO INTRA EXTRACRAN SEL INTERNAL CAROTID BILAT MOD SED: IMG5363

## 2020-03-07 HISTORY — PX: IR US GUIDE VASC ACCESS RIGHT: IMG2390

## 2020-03-07 HISTORY — PX: IR ANGIO VERTEBRAL SEL VERTEBRAL UNI L MOD SED: IMG5367

## 2020-03-07 HISTORY — PX: IR ANGIO VERTEBRAL SEL SUBCLAVIAN INNOMINATE UNI R MOD SED: IMG5365

## 2020-03-07 LAB — GLUCOSE, CAPILLARY
Glucose-Capillary: 87 mg/dL (ref 70–99)
Glucose-Capillary: 89 mg/dL (ref 70–99)
Glucose-Capillary: 166 mg/dL — ABNORMAL HIGH (ref 70–99)
Glucose-Capillary: 146 mg/dL — ABNORMAL HIGH (ref 70–99)

## 2020-03-07 LAB — BASIC METABOLIC PANEL
Anion gap: 8 (ref 5–15)
BUN: 17 mg/dL (ref 8–23)
CO2: 26 mmol/L (ref 22–32)
Calcium: 8.4 mg/dL — ABNORMAL LOW (ref 8.9–10.3)
Chloride: 98 mmol/L (ref 98–111)
Creatinine, Ser: 1.34 mg/dL — ABNORMAL HIGH (ref 0.61–1.24)
GFR calc Af Amer: 54 mL/min — ABNORMAL LOW (ref >60)
GFR calc non Af Amer: 47 mL/min — ABNORMAL LOW (ref >60)
Glucose, Bld: 103 mg/dL — ABNORMAL HIGH (ref 70–99)
Potassium: 4.2 mmol/L (ref 3.5–5.1)
Sodium: 132 mmol/L — ABNORMAL LOW (ref 135–145)

## 2020-03-07 LAB — CBC
HCT: 33.7 % — ABNORMAL LOW (ref 39.0–52.0)
Hemoglobin: 11.2 g/dL — ABNORMAL LOW (ref 13.0–17.0)
MCH: 30.7 pg (ref 26.0–34.0)
MCHC: 33.2 g/dL (ref 30.0–36.0)
MCV: 92.3 fL (ref 80.0–100.0)
Platelets: 188 10*3/uL (ref 150–400)
RBC: 3.65 MIL/uL — ABNORMAL LOW (ref 4.22–5.81)
RDW: 13.2 % (ref 11.5–15.5)
WBC: 5.8 10*3/uL (ref 4.0–10.5)
nRBC: 0 % (ref 0.0–0.2)

## 2020-03-07 LAB — TSH: TSH: 2.605 u[IU]/mL (ref 0.350–4.500)

## 2020-03-07 MED ORDER — FENTANYL CITRATE (PF) 100 MCG/2ML IJ SOLN
INTRAMUSCULAR | Status: AC
  Filled 2020-03-07: qty 2

## 2020-03-07 MED ORDER — MIDAZOLAM HCL 2 MG/2ML IJ SOLN
INTRAMUSCULAR | Status: AC | PRN
  Administered 2020-03-07: 0.5 mg via INTRAVENOUS

## 2020-03-07 MED ORDER — FENTANYL CITRATE (PF) 100 MCG/2ML IJ SOLN
INTRAMUSCULAR | Status: AC | PRN
  Administered 2020-03-07: 25 ug via INTRAVENOUS

## 2020-03-07 MED ORDER — HEPARIN SODIUM (PORCINE) 1000 UNIT/ML IJ SOLN
INTRAMUSCULAR | Status: AC
  Filled 2020-03-07: qty 1

## 2020-03-07 MED ORDER — ASPIRIN EC 81 MG PO TBEC
81.0000 mg | DELAYED_RELEASE_TABLET | Freq: Every day | ORAL | Status: DC
  Administered 2020-03-08 – 2020-03-10 (×3): 81 mg via ORAL
  Filled 2020-03-07 (×3): qty 1

## 2020-03-07 MED ORDER — MIDAZOLAM HCL 2 MG/2ML IJ SOLN
INTRAMUSCULAR | Status: AC
  Filled 2020-03-07: qty 2

## 2020-03-07 MED ORDER — LIDOCAINE HCL 1 % IJ SOLN
INTRAMUSCULAR | Status: AC
  Filled 2020-03-07: qty 20

## 2020-03-07 MED ORDER — FLUDROCORTISONE ACETATE 0.1 MG PO TABS
0.1000 mg | ORAL_TABLET | Freq: Every day | ORAL | Status: DC
  Administered 2020-03-07 – 2020-03-10 (×4): 0.1 mg via ORAL
  Filled 2020-03-07 (×4): qty 1

## 2020-03-07 MED ORDER — SODIUM CHLORIDE 0.9 % IV SOLN: INTRAVENOUS | Status: AC

## 2020-03-07 MED ORDER — HEPARIN SODIUM (PORCINE) 5000 UNIT/ML IJ SOLN
5000.0000 [IU] | Freq: Two times a day (BID) | INTRAMUSCULAR | Status: DC
  Administered 2020-03-07 – 2020-03-10 (×6): 5000 [IU] via SUBCUTANEOUS
  Filled 2020-03-07 (×7): qty 1

## 2020-03-07 MED ORDER — IOHEXOL 240 MG/ML SOLN
INTRAMUSCULAR | Status: AC
  Administered 2020-03-07: 75 mL
  Filled 2020-03-07: qty 100

## 2020-03-07 NOTE — TOC Progression Note (Addendum)
Transition of Care [facility]) - Progression Note    Patient Details  Name: Franz Svec MRN: [id number] Date of Birth: [birth year]  Transition of Care [facility]) CM/SW Contact  Nonda Lou, [place] Phone Number: 03/07/2020, 2:09 PM  Clinical Narrative:    Update: CSW spoke with Luanna Cole of [facility]. Luanna Cole stated it's possible patient could return but staffing would not be able to provide 24hr supervision. Requested clinical information be faxed for further review. CSW contacted patient's daughter Zella Ball to discuss. Daughter was working and requested CSW follow-up Friday.   CSW contacted [facility] to inquire on patient discharging back to facility once medically stable. Left a message requesting a call back for follow-up.   CSW contacted patient's daughter Zella Ball, left a message.   Expected Discharge Plan: Skilled Nursing Facility Barriers to Discharge: Continued Medical Work up  Expected Discharge Plan and Services Expected Discharge Plan: Skilled Nursing Facility In-house Referral: Clinical Social Work Discharge Planning Services: CM Consult Post Acute Care Choice: Skilled Nursing Facility Living arrangements for the past 2 months: Apartment                                       Social Determinants of Health (SDOH) Interventions    Readmission Risk Interventions No flowsheet data found.

## 2020-03-07 NOTE — Progress Notes (Signed)
STROKE TEAM PROGRESS NOTE   INTERVAL HISTORY Patient is lying comfortably in bed.  He has no complaints.  Repeat EEG done yesterday shows mild generalized slowing without epileptiform activity.  Four-vessel cerebral catheter angiogram done this morning shows no significant large vessel extracranial stenosis but there is diffuse intracranial atherosclerotic changes.  Patient had orthostatic vital signs documented yesterday when he was stood up with physical therapy and he did not have any significant change immediately upon standing but 2 minutes into standing he developed tachycardia with some hypotension and symptoms of dizziness and near fainting.  Vitals:   03/07/20 1120 03/07/20 1125 03/07/20 1130 03/07/20 1140  BP: 130/60 131/69 129/61 (!) 144/64  Pulse: 69 68 64 72  Resp: 12 15 14 15   Temp:      TempSrc:      SpO2: 98% 99% 100% 100%  Weight:      Height:        CBC:  Recent Labs  Lab 03/04/20 1110 03/04/20 1110 03/04/20 1112 03/07/20 0441  WBC 7.1  --   --  5.8  NEUTROABS 4.7  --   --   --   HGB 12.9*   < > 12.9* 11.2*  HCT 39.0   < > 38.0* 33.7*  MCV 94.2  --   --  92.3  PLT 208  --   --  188   < > = values in this interval not displayed.    Basic Metabolic Panel:  Recent Labs  Lab 03/06/20 0729 03/07/20 0441  NA 133* 132*  K 3.8 4.2  CL 101 98  CO2 27 26  GLUCOSE 94 103*  BUN 17 17  CREATININE 1.37* 1.34*  CALCIUM 8.6* 8.4*   Lipid Panel:     Component Value Date/Time   CHOL 107 03/05/2020 0253   TRIG 47 03/05/2020 0253   HDL 50 03/05/2020 0253   CHOLHDL 2.1 03/05/2020 0253   VLDL 9 03/05/2020 0253   LDLCALC 48 03/05/2020 0253   HgbA1c:  Lab Results  Component Value Date   HGBA1C 7.5 (H) 03/05/2020    Alcohol Level     Component Value Date/Time   ETH <10 03/04/2020 1110    IMAGING past 24 hours EEG adult  Result Date: 03/07/2020 03/09/2020, MD     03/07/2020  8:49 AM Patient Name: Joe Peters MRN: Joe Peters Epilepsy Attending:  [id number] Referring Physician/Provider: Dr Charlsie Quest Date: 03/06/2020 Duration: 24.20 mins Patient history: 86yo M who has had multiple daily episodes when he stands up his face becomes pale and he is staring briefly unresponsive and tends to fall down.  She is never noticed any lipsmacking or tongue bite but yesterday he had an episode in which the nurse witness fluttering of his eyelids and some trembling of his legs when he stood up. EEG to evaluate for seizure Level of alertness: awake, asleep AEDs during EEG study: None Technical aspects: This EEG study was done with scalp electrodes positioned according to the 10-20 International system of electrode placement. Electrical activity was acquired at a sampling rate of 500Hz  and reviewed with a high frequency filter of 70Hz  and a low frequency filter of 1Hz . EEG data were recorded continuously and digitally stored. DESCRIPTION: The posterior dominant rhythm consists of 8 Hz activity of moderate voltage (25-35 uV) seen predominantly in posterior head regions, symmetric and reactive to eye opening and eye closing. Sleep was characterized by vertex waves, sleep spindles (12-14Hz ), maximal frontocentral. EEG also showed intermittent  generalized polymorphic 3-6Hz  theta-delta slowing. Hyperventilation and photic stimulation were not performed. ABNORMALITY - Intermittent slow, generalized IMPRESSION: This study is suggestive of mild diffuse encephalopathy, non specific to etiology. No seizures or epileptiform discharges were seen throughout the recording. [place]   Pleasant elderly Caucasian male not in distress.  He is has decreased hearing bilaterally. . Afebrile. Head is nontraumatic. Neck is supple without bruit.    Cardiac exam no murmur or gallop. Lungs are clear to auscultation. Distal pulses are well felt. Neurological Exam ;  Awake  Alert oriented x 3. Normal speech and language.  Diminished attention, registration and  recall..Eye movements full without nystagmus.fundi were not visualized. Vision acuity and fields appear normal. Hearing is normal. Palatal movements are normal. Face symmetric. Tongue midline. Normal strength, tone, reflexes and coordination. Normal sensation. Gait deferred.  ASSESSMENT/PLAN Joe Peters is a 86 y.o. male with history of diabetes, BPH, hypothyroidism, urinary retention, aortic aneurysm with endovascular stent graft, depression, hypercholesterolemia, dementia and possible TIAs in the past presenting with dizziness, gait instability and slurred speech.   Recurrent transient episodes of dizziness and nearly passing out upon standing near syncope likely related to autonomic insufficiency-orthostatic intolerance versus POTS syndrome.. Vertebrobasilar TIA or seizures are less likely  CT head No acute abnormality. Small vessel disease. Atrophy. ASPECTS 10.     CTA head & neck no LVO. Proximal L ICA < 50% stenosis. L V2 focal severe stenosis. Significant atherosclerosis throughout.   Repeat CT at 24h no change  2D Echo EF 55-60%. No source of embolus   Catheter angio L ICA < 50% stenosis. Diffuse intracranial atherosclerosis w/ multifocal areas severe anterior and posterior stenoses   EEG focal left temporal 3 to 4 Hz slowing  Repeat EEG mild generalized slowing  Keppra discontinued 03/07/2020  LDL 48  HgbA1c 7.5  Heparin 5000 units sq q12h for VTE prophylaxis  pletal prior to admission, now on aspirin 81 mg daily and pletal. Continue at d/c.   Therapy recommendations:  SNF (currently at ALF level, may be able to provide appropriate care vs SNF)  Disposition:  pending   Severe intracranial atherosclerosis   Catheter angio L ICA < 50% stenosis. Diffuse intracranial atherosclerosis w/ multifocal areas severe anterior and posterior stenoses   Hypertension  Stable . BP goal normotensive  Hyperlipidemia  Home meds:  zocor 55  Now on lipitor 80  LDL 48, at  goal < 52  Ok to continue home statin, continue at discharge  Diabetes type II Uncontrolled  HgbA1c 7.5, goal < 7.0  Other Stroke Risk Factors  Advanced age  Hx AAA s/p stent graft on pletal  Hx TIA per hx, no documentation in EPIC  Other Active Problems  CKD IIIa  hypothyroid on synthroid   BPH on tamsulosin  Anxiety/depression  Hospital day # 2 Recommend discontinue Keppra.  Recommend adequate hydration and do orthostatic tolerance exercises if he has been lying down or sitting for more than 20 minutes.  Start Florinef 0.1 mg daily.  Follow-up with his primary care physician/cardiology for adjustment of his medications to reduce orthostatic hypotension.  Discussed with Dr. Darene Lamer interventional radiology and Dr. Nevada Crane and answered questions.  I called the patient daughter Fransico Michael but was unable to leave a message as the mailbox was full.  Follow-up as an outpatient stroke clinic in 6 weeks.  Stroke team will sign off.  Kindly call for questions.  Greater than 50% time during this  25-minute visit was spent on counseling and coordination of care and discussion with care team  Delia Heady, MD Medical Director [facility] Pager: [phone] 03/07/2020 2:47 PMTo contact Stroke Continuity provider, please refer to [email or web address]. After hours, contact General Neurology

## 2020-03-07 NOTE — Procedures (Signed)
INTERVENTIONAL NEURORADIOLOGY BRIEF POSTPROCEDURE NOTE  Attending: Dr. Baldemar Lenis, MD  Assistant: None  Diagnosis: Left carotid stenosis; ICAD  Access site: RCFA  Access closure: 46F exoseal  Anesthesia: Moderate sedation  Medication used: 0.5 mg Versed IV; 25 mcg Fentanyl IV.  Complications: None  Estimated blood loss: Minimal  Specimen: None  Findings:  - Atherosclerotic changes of the left carotid bulb with less than 50% stenosis. - Diffuse intracranial atherosclerotic disease with multifocal areas of severe stenosis in the anterior and posterior circulation.   The patient tolerated the procedure well without incident or complication and is in stable condition.

## 2020-03-07 NOTE — Procedures (Addendum)
Patient Name: Joe Peters  MRN: [id number]  Epilepsy Attending: Charlsie Quest  Referring Physician/Provider: Dr Delia Heady Date: 03/06/2020 Duration: 24.20 mins  Patient history: 86yo M who has had multiple daily episodes when he stands up his face becomes pale and he is staring briefly unresponsive and tends to fall down.  She is never noticed any lipsmacking or tongue bite but yesterday he had an episode in which the nurse witness fluttering of his eyelids and some trembling of his legs when he stood up. EEG to evaluate for seizure  Level of alertness: awake, asleep  AEDs during EEG study: None  Technical aspects: This EEG study was done with scalp electrodes positioned according to the 10-20 International system of electrode placement. Electrical activity was acquired at a sampling rate of 500Hz  and reviewed with a high frequency filter of 70Hz  and a low frequency filter of 1Hz . EEG data were recorded continuously and digitally stored.   DESCRIPTION: The posterior dominant rhythm consists of 8 Hz activity of moderate voltage (25-35 uV) seen predominantly in posterior head regions, symmetric and reactive to eye opening and eye closing. Sleep was characterized by vertex waves, sleep spindles (12-14Hz ), maximal frontocentral. EEG also showed intermittent generalized polymorphic 3-6Hz  theta-delta slowing. Hyperventilation and photic stimulation were not performed.  ABNORMALITY - Intermittent slow, generalized   IMPRESSION: This study is suggestive of mild diffuse encephalopathy, non specific to etiology. No seizures or epileptiform discharges were seen throughout the recording.  Erika Slaby 

## 2020-03-07 NOTE — Progress Notes (Signed)
MEDICATION RELATED CONSULT NOTE - FOLLOW UP   Pharmacy Consult for: Post IR Procedure Consult- Pharmacist please review PTA and Inpatient med lists for interrupted anticoagulation / antiplatelet orders. May resume prophylaxis drug orders and therapeutic drug orders with prior protocols   Allergies  Allergen Reactions  . Penicillins Other (See Comments)    Reaction unknown -- occurred in childhood  . Sulfa Antibiotics Other (See Comments)    Reaction unknown -- occurred in childhood    Patient Measurements: Height: 5\' 11"  (180.3 cm) Weight: 67.1 kg (147 lb 14.9 oz) IBW/kg (Calculated) : 75.3  Vital Signs: Temp: 97.8 F (36.6 C) (05/13 0756) Temp Source: Oral (05/13 0756) BP: 144/64 (05/13 1140) Pulse Rate: 72 (05/13 1140) Intake/Output from previous day: 05/12 0701 - 05/13 0700 In: 0  Out: 1625 [Urine:1625] Intake/Output from this shift: Total I/O In: -  Out: 250 [Urine:250]  Labs: Recent Labs    03/05/20 0253 03/06/20 0729 03/07/20 0441  WBC  --   --  5.8  HGB  --   --  11.2*  HCT  --   --  33.7*  PLT  --   --  188  CREATININE 1.35* 1.37* 1.34*   Estimated Creatinine Clearance: 35.5 mL/min (A) (by C-G formula based on SCr of 1.34 mg/dL (H)).   Assessment: 86 y.o male here with TIA.   Rx consult Post IR , f/u for restart of anticoagulation/antiplatelet.  Patient was on sq heparin 5000 units SQ q12h vte prophylaxis therapy. (per ischemic stroke/TIA order panel).  Not on anticoagulation prior to admit.  He was on cilostazol (pletal) prior to admit, resumed inpatient and continues inpatient. S/p IR angio carotid for left carotid stenosis , blood loss: minimal. Okay to resume UFH vte ppx on day 0 ,in 6 hr or with next standard dose interval.   Plan:   Resume Heparin 5000 units SQ q12 hour (per ischemic stroke/TIA order set)- restart at 1800.     92, RPh Clinical Pharmacist Please check AMION for all [facility] phone umbers After 10:00 PM, call Main  Pharmacy [phone]  03/07/2020,1:25 PM

## 2020-03-07 NOTE — Progress Notes (Signed)
PROGRESS NOTE  Joe Peters YJE:[id number] DOB: 1928-08-01 DOA: 03/04/2020 PCP: Marton Redwood, MD  HPI/Recap of past 24 hours:  Joe Peters is a 86 y.o. male with medical history significant of HTN, IDDM, PVD, diabetic neuropathy, AAA rupture status post repair, HLD, hypothyroidism, brought in for code stroke.  Patient was at assisted living facility, has chronic ambulation dysfunction using a walker intermittently.    The morning of presentation during a regular physical therapy, staff noticed patient suddenly stopped walking and became very wobbly and about to fall, meantime seemed was unable to verbalize for few minutes. They were able to hold him and no fall happened. Patient remembered as he felt like he was about to fall, and he wanted to ask for help but could not talk despite normal understanding and forming ideas in his mind. EMS on route noticed patient has intermittent slurred speech.  Patient denies any lightheaded or blurred vision, no chest pain or palpitations during this episode.  He recently moved from [place] to join his daughter who works as a Marine scientist at [employer].  ED Course: Patient is a [place] War veteran, has broken bone and repaired with some unknown metals in his legs in 1950s. MRI unable to perform. CTA: No large vessel occlusion. Focal severe stenosis of the left V2 vertebral artery without apparent distal flow limitation.  Significant intracranial atherosclerosis.  CT head:No acute intracranial hemorrhage or evidence of acute infarction.  5/11 AM  when working with PT OT became minimally responsive with eyes flickering, lasting about 15 seconds.  EEG ordered to assess for possible seizure, no epileptiform activity but showed focal left temporal slowing.  Neurology following.  03/07/20: Seen and examined at his bedside this morning.  No acute events overnight.  He is somnolent but easily arousable to voices.  Possible image-guided diagnostic cerebral  arteriogram today by interventional radiology.   Assessment/Plan: Active Problems:   TIA (transient ischemic attack)   HTN (hypertension)   DM (diabetes mellitus), secondary, uncontrolled, with neurologic complications (HCC)   Possible complex partial seizure versus TIA versus near syncope -Unable to perform MRI due to metal seen in his legs unclear if MRI compatible. -No large vessel occlusion on CTA head and neck. -Focal severe stenosis of the left V2 vertebral artery without apparent distal flow limitation. -Dysarthria-resolved -EEG showed focal left temporal slowing.  Second EEG 5/12 showed mild diffuse encephalopathy, nonspecific to etiology.  No seizures or epileptiform discharges were seen throughout the recording. -Possible image-guided diagnostic cerebral arteriogram today by interventional radiology. -Continue Pletal and Zocor  Suspected seizure activity/possible complex partial seizure -when working with PT OT became minimally responsive with eyes flickering, lasting about 15 seconds.  Eyes flickering have also been witnessed by his daughter who states they are becoming more frequent. EEG 5/11 showed left temporal slowing  Seen by neurology, discussed with Dr. Leonie Man, starting empirical trial of Keppra Continue seizure precautions Continue to closely monitor  Severe intracranial atherosclerosis Findings as stated above Continue aspirin, Lipitor, Pletal  Hypovolemic hyponatremia, improved with normal saline IV hydration -Likely from HCTZ, hold BP meds for now and allow permissive hypertension -Currently on normal saline at 75 cc/h x2 days -Serum Na+ trending up 133 from 128  HTN Gradually normalize blood pressure Continue to monitor vital signs -As needed hydralazine for now for BP more than 220/120   CKD IIIA Baseline cr 1.3 GFR 46 Continue to avoid nephrotoxins and hypotension  HLD C/w High intensity statin, Zocor 40 mg daily Continue fenofibrate  IDDM2  with hyperglycemia -a1C 7.5 on 03/05/20 Hold off oral hypoglycemics Continue Lantus 5 units daily and insulin sliding scale. Avoid hypoglycemia  Hypothyroid Add on TSH Continue Synthroid  BPH Continue tamsulosin Continue to monitor urine output  Chronic anxiety/depression- Stable Continue Zoloft  DVT prophylaxis: Heparin subcu TID Code Status: Full code Family Communication:  Updated daughter via phone 5/12.  Consults called: Neurology    Dispo: The patient is from:   ALF                       Anticipated d/c is to: ALF              Anticipated d/c date is: when Neurology signs off.              Patient currently undergoing work up for neurological changes.        Objective: Vitals:   03/07/20 1055 03/07/20 1100 03/07/20 1105 03/07/20 1110  BP: (!) 103/54 (!) 107/55 (!) 112/59 123/61  Pulse: 64 68 66 67  Resp: 14 13 15 10   Temp:      TempSrc:      SpO2: 100% 100% 100% 100%  Weight:      Height:        Intake/Output Summary (Last 24 hours) at 03/07/2020 1112 Last data filed at 03/07/2020 0757 Gross per 24 hour  Intake 0 ml  Output 1625 ml  Net -1625 ml   Filed Weights   03/04/20 1100  Weight: 67.1 kg    Exam:  . General: 86 y.o. year-old male well-developed well-nourished no acute distress.  Somnolent but easily arousable to voices.   . Cardiovascular: Regular rate and rhythm no rubs or gallops.   92 Respiratory: Clear to auscultation no wheezes no rales. . Abdomen: Soft nontender normal bowel sounds present.   . Musculoskeletal: No lower extremity edema bilaterally.   Marland Kitchen Psychiatry: Mood is appropriate for condition and setting.   Data Reviewed: CBC: Recent Labs  Lab 03/04/20 1110 03/04/20 1112 03/07/20 0441  WBC 7.1  --  5.8  NEUTROABS 4.7  --   --   HGB 12.9* 12.9* 11.2*  HCT 39.0 38.0* 33.7*  MCV 94.2  --  92.3  PLT 208  --  188   Basic Metabolic Panel: Recent Labs  Lab 03/04/20 1110 03/04/20 1112 03/05/20 0253 03/06/20 0729  03/07/20 0441  NA 130* 128* 132* 133* 132*  K 4.2 4.1 3.7 3.8 4.2  CL 94* 92* 100 101 98  CO2 23  --  26 27 26   GLUCOSE 259* 252* 100* 94 103*  BUN 18 20 16 17 17   CREATININE 1.52* 1.50* 1.35* 1.37* 1.34*  CALCIUM 9.5  --  8.6* 8.6* 8.4*   GFR: Estimated Creatinine Clearance: 35.5 mL/min (A) (by C-G formula based on SCr of 1.34 mg/dL (H)). Liver Function Tests: Recent Labs  Lab 03/04/20 1110  AST 17  ALT 13  ALKPHOS 36*  BILITOT 1.3*  PROT 5.9*  ALBUMIN 3.4*   No results for input(s): LIPASE, AMYLASE in the last 168 hours. No results for input(s): AMMONIA in the last 168 hours. Coagulation Profile: Recent Labs  Lab 03/04/20 1110  INR 1.1   Cardiac Enzymes: No results for input(s): CKTOTAL, CKMB, CKMBINDEX, TROPONINI in the last 168 hours. BNP (last 3 results) No results for input(s): PROBNP in the last 8760 hours. HbA1C: Recent Labs    03/05/20 0253  HGBA1C 7.5*   CBG: Recent Labs  Lab  03/06/20 0624 03/06/20 1129 03/06/20 1706 03/06/20 2117 03/26/2020 0610  GLUCAP 92 164* 214* 173* 87   Lipid Profile: Recent Labs    03/05/20 0253  CHOL 107  HDL 50  LDLCALC 48  TRIG 47  CHOLHDL 2.1   Thyroid Function Tests: No results for input(s): TSH, T4TOTAL, FREET4, T3FREE, THYROIDAB in the last 72 hours. Anemia Panel: No results for input(s): VITAMINB12, FOLATE, FERRITIN, TIBC, IRON, RETICCTPCT in the last 72 hours. Urine analysis: No results found for: COLORURINE, APPEARANCEUR, LABSPEC, PHURINE, GLUCOSEU, HGBUR, BILIRUBINUR, KETONESUR, PROTEINUR, UROBILINOGEN, NITRITE, LEUKOCYTESUR Sepsis Labs: @LABRCNTIP (procalcitonin:4,lacticidven:4)  ) Recent Results (from the past 240 hour(s))  SARS Coronavirus 2 by RT PCR (hospital order, performed in [facility] hospital lab) Nasopharyngeal Nasopharyngeal Swab     Status: None   Collection Time: 03/04/20  4:00 PM   Specimen: Nasopharyngeal Swab  Result Value Ref Range Status   SARS Coronavirus 2 NEGATIVE NEGATIVE  Final    Comment: (NOTE) SARS-CoV-2 target nucleic acids are NOT DETECTED. The SARS-CoV-2 RNA is generally detectable in upper and lower respiratory specimens during the acute phase of infection. The lowest concentration of SARS-CoV-2 viral copies this assay can detect is 250 copies / mL. A negative result does not preclude SARS-CoV-2 infection and should not be used as the sole basis for treatment or other patient management decisions.  A negative result may occur with improper specimen collection / handling, submission of specimen other than nasopharyngeal swab, presence of viral mutation(s) within the areas targeted by this assay, and inadequate number of viral copies (<250 copies / mL). A negative result must be combined with clinical observations, patient history, and epidemiological information. Fact Sheet for Patients:   05/04/20 Fact Sheet for Healthcare Providers: BoilerBrush.com.cy This test is not yet approved or cleared  by the https://pope.com/ FDA and has been authorized for detection and/or diagnosis of SARS-CoV-2 by FDA under an Emergency Use Authorization (EUA).  This EUA will remain in effect (meaning this test can be used) for the duration of the COVID-19 declaration under Section 564(b)(1) of the Act, 21 U.S.C. section 360bbb-3(b)(1), unless the authorization is terminated or revoked sooner. Performed at [facility], [address] [place], [place]       Studies: EEG adult  Result Date: 03-26-20 03/09/2020, MD     03/26/20  8:49 AM Patient Name: Joe Peters MRN: [facility]ile[facility] Attending: [id number] Referring Physician/Provider: Dr Charlsie Quest Date: 03/06/2020 Duration: 24.20 mins Patient history: 86yo M who has had multiple daily episodes when he stands up his face becomes pale and he is staring briefly unresponsive and tends to fall down.  She is never noticed any  lipsmacking or tongue bite but yesterday he had an episode in which the nurse witness fluttering of his eyelids and some trembling of his legs when he stood up. EEG to evaluate for seizure Level of alertness: awake, asleep AEDs during EEG study: None Technical aspects: This EEG study was done with scalp electrodes positioned according to the 10-20 International system of electrode placement. Electrical activity was acquired at a sampling rate of 500Hz  and reviewed with a high frequency filter of 70Hz  and a low frequency filter of 1Hz . EEG data were recorded continuously and digitally stored. DESCRIPTION: The posterior dominant rhythm consists of 8 Hz activity of moderate voltage (25-35 uV) seen predominantly in posterior head regions, symmetric and reactive to eye opening and eye closing. Sleep was characterized by vertex waves, sleep spindles (12-14Hz ),  maximal frontocentral. EEG also showed intermittent generalized polymorphic 3-6Hz  theta-delta slowing. Hyperventilation and photic stimulation were not performed. ABNORMALITY - Intermittent slow, generalized IMPRESSION: This study is suggestive of mild diffuse encephalopathy, non specific to etiology. No seizures or epileptiform discharges were seen throughout the recording. Priyanka Annabelle Harman    Scheduled Meds: . cilostazol  50 mg Oral BID  . donepezil  10 mg Oral QHS  . fenofibrate  54 mg Oral Daily  . fentaNYL      . gabapentin  100 mg Oral BID  . heparin sodium (porcine)      . insulin aspart  0-9 Units Subcutaneous TID WC  . insulin glargine  5 Units Subcutaneous QHS  . iohexol      . levETIRAcetam  500 mg Oral Daily  . levothyroxine  50 mcg Oral QAC breakfast  . lidocaine      . midazolam      . sertraline  50 mg Oral Daily  . simvastatin  40 mg Oral q1800  . tamsulosin  0.4 mg Oral Daily    Continuous Infusions: . sodium chloride 75 mL/hr at 03/07/20 0508     LOS: 2 days     Darlin Drop, MD [facility] Pager [phone]   If 7PM-7AM, please contact night-coverage [email or web address] Password [facility] 03/07/2020, 11:12 AM

## 2020-03-07 NOTE — Sedation Documentation (Signed)
Right femoral sheath removed. 5fr exoseal closure device used in right groin. 

## 2020-03-08 LAB — GLUCOSE, CAPILLARY
Glucose-Capillary: 89 mg/dL (ref 70–99)
Glucose-Capillary: 162 mg/dL — ABNORMAL HIGH (ref 70–99)
Glucose-Capillary: 219 mg/dL — ABNORMAL HIGH (ref 70–99)
Glucose-Capillary: 227 mg/dL — ABNORMAL HIGH (ref 70–99)

## 2020-03-08 LAB — BASIC METABOLIC PANEL
Anion gap: 7 (ref 5–15)
BUN: 16 mg/dL (ref 8–23)
CO2: 22 mmol/L (ref 22–32)
Calcium: 8.1 mg/dL — ABNORMAL LOW (ref 8.9–10.3)
Chloride: 104 mmol/L (ref 98–111)
Creatinine, Ser: 1.34 mg/dL — ABNORMAL HIGH (ref 0.61–1.24)
GFR calc Af Amer: 54 mL/min — ABNORMAL LOW (ref >60)
GFR calc non Af Amer: 47 mL/min — ABNORMAL LOW (ref >60)
Glucose, Bld: 97 mg/dL (ref 70–99)
Potassium: 3.8 mmol/L (ref 3.5–5.1)
Sodium: 133 mmol/L — ABNORMAL LOW (ref 135–145)

## 2020-03-08 LAB — CBC
HCT: 36 % — ABNORMAL LOW (ref 39.0–52.0)
Hemoglobin: 11.8 g/dL — ABNORMAL LOW (ref 13.0–17.0)
MCH: 30.6 pg (ref 26.0–34.0)
MCHC: 32.8 g/dL (ref 30.0–36.0)
MCV: 93.3 fL (ref 80.0–100.0)
Platelets: 179 10*3/uL (ref 150–400)
RBC: 3.86 MIL/uL — ABNORMAL LOW (ref 4.22–5.81)
RDW: 13.2 % (ref 11.5–15.5)
WBC: 7.6 10*3/uL (ref 4.0–10.5)
nRBC: 0 % (ref 0.0–0.2)

## 2020-03-08 MED ORDER — HYDRALAZINE HCL 10 MG PO TABS: 10.0000 mg | ORAL_TABLET | Freq: Three times a day (TID) | ORAL | Status: DC

## 2020-03-08 NOTE — Progress Notes (Signed)
Physical Therapy Treatment Patient Details Name: Joe Peters MRN: [id number] DOB: June 20, 1928 Today's Date: 03/08/2020    History of Present Illness Pt is 86 yo male with PMH including HTN, hypelipidemia, PVD, ruptured AAA s/p repair, DM, neuropathy, and hypothyroidism. Pt came to ED with near syncopal episode with associated difficulty with word finding.  Pt with EEG that showed L temporal slowing but no indications of seizure activity.  CT head revealed no acute abnormality, pt unable to have MRI.  Pt with possible complex partial seizure vs hemodynamic TIA vs near syncope per neuro note.  Pt did have orthostatic hypotension earlier today 5/12 and scheduled for possible cerebral artirogram to evaluate vascular cause of syncope.    PT Comments    Patient seen for mobility progression. Pt able to transfer OOB to [facility] and ambulate ~3 ft to recliner with RW and +2 assist for safety. Pt with ted hose and ace wraps up to thighs to aid in maintaining BP with postural changes. Pt with c/o mild "wooziness" with supine to sit and reports no dizziness with subsequent mobility. Pt will continue to benefit from further skilled PT services in both acute and subacute settings to maximize independence and safety with mobility.   BP in supine 108/47 (66) BP in sitting 130/50 (71) BP in standing 111/85 (91) BP in sitting after transfers 123/61 (81)    Follow Up Recommendations  SNF;Supervision/Assistance - 24 hour(Family looking into if ALF could provide temporary increased supprot per notes)     Equipment Recommendations  None recommended by PT    Recommendations for Other Services       Precautions / Restrictions Precautions Precautions: Fall Precaution Comments: Watch BP Restrictions Weight Bearing Restrictions: No    Mobility  Bed Mobility Overal bed mobility: Needs Assistance Bed Mobility: Supine to Sit     Supine to sit: Min guard     General bed mobility comments: cues for use  of rail and sequencing; min guard for safety  Transfers Overall transfer level: Needs assistance Equipment used: 2 person hand held assist;Rolling walker (2 wheeled) Transfers: Sit to/from [employer] Sit to Stand: Min assist;Mod assist;+2 physical assistance;+2 safety/equipment Stand pivot transfers: +2 safety/equipment;Min assist;+2 physical assistance       General transfer comment: initial stand from EOB and without RW pt unable to stand upright likely due to painful feet so RW used and pt able to achieve upright posture; assistance required for power up into standing from EOB and BSC and for balance and managing RW for SPT; cues for safe hand placement and sequencing   Ambulation/Gait Ambulation/Gait assistance: Min assist;+2 safety/equipment(chair close) Gait Distance (Feet): 3 Feet Assistive device: Rolling walker (2 wheeled)   Gait velocity: decreased   General Gait Details: pt able to take side steps with RW and min A for balance; limited by fatigue and foot pain but reports no dizziness   Stairs             Wheelchair Mobility    Modified Rankin (Stroke Patients Only) Modified Rankin (Stroke Patients Only) Pre-Morbid Rankin Score: Moderate disability Modified Rankin: Moderately severe disability     Balance Overall balance assessment: Needs assistance Sitting-balance support: Feet supported;No upper extremity supported Sitting balance-Leahy Scale: Fair     Standing balance support: Bilateral upper extremity supported;During functional activity Standing balance-Leahy Scale: Poor Standing balance comment: pt with posterior bias at times and tends to keep weight through heels while in standing  Cognition Arousal/Alertness: Awake/alert Behavior During Therapy: WFL for tasks assessed/performed Overall Cognitive Status: History of cognitive impairments - at baseline(history of dementia)                                  General Comments: WFL for mobility tasks during session      Exercises      General Comments        Pertinent Vitals/Pain Pain Assessment: Faces Faces Pain Scale: Hurts even more Pain Location: feet (L > R) Pain Descriptors / Indicators: Guarding;Grimacing;Sore;Tender Pain Intervention(s): Limited activity within patient's tolerance;Monitored during session;Repositioned    Home Living                      Prior Function            PT Goals (current goals can now be found in the care plan section) Progress towards PT goals: Progressing toward goals    Frequency    Min 3X/week      PT Plan Current plan remains appropriate    Co-evaluation PT/OT/SLP Co-Evaluation/Treatment: Yes Reason for Co-Treatment: Complexity of the patient's impairments (multi-system involvement);For patient/therapist safety;To address functional/ADL transfers PT goals addressed during session: Mobility/safety with mobility        AM-PAC PT "6 Clicks" Mobility   Outcome Measure  Help needed turning from your back to your side while in a flat bed without using bedrails?: A Little Help needed moving from lying on your back to sitting on the side of a flat bed without using bedrails?: A Little Help needed moving to and from a bed to a chair (including a wheelchair)?: A Little Help needed standing up from a chair using your arms (e.g., wheelchair or bedside chair)?: A Little Help needed to walk in hospital room?: A Lot Help needed climbing 3-5 steps with a railing? : Total 6 Click Score: 15    End of Session Equipment Utilized During Treatment: Gait belt Activity Tolerance: Patient tolerated treatment well Patient left: with call bell/phone within reach;in chair;with chair alarm set Nurse Communication: Mobility status PT Visit Diagnosis: Unsteadiness on feet (R26.81);Repeated falls (R29.6);Difficulty in walking, not elsewhere classified (R26.2)      Time: 9476-5465 PT Time Calculation (min) (ACUTE ONLY): 48 min  Charges:  $Gait Training: 8-22 mins                     Earney Navy, PTA Acute Rehabilitation Services Pager: [phone] Office: [phone]     Darliss Cheney 03/08/2020, 2:19 PM

## 2020-03-08 NOTE — Progress Notes (Signed)
PROGRESS NOTE  Joe Peters SPQ:[id number] DOB: [birth year] DOA: 03/04/2020 PCP: Martha Clan, MD  HPI/Recap of past 24 hours:  Joe Peters is a 86 y.o. male with medical history significant of HTN, IDDM, PVD, diabetic neuropathy, AAA rupture status post repair, HLD, hypothyroidism, brought in for code stroke.  Patient was at assisted living facility, has chronic ambulation dysfunction using a walker intermittently.    The morning of presentation during a regular physical therapy, staff noticed patient suddenly stopped walking and became very wobbly and about to fall, meantime seemed was unable to verbalize for few minutes. They were able to hold him and no fall happened. Patient remembered as he felt like he was about to fall, and he wanted to ask for help but could not talk despite normal understanding and forming ideas in his mind. EMS on route noticed patient has intermittent slurred speech.  Patient denies any lightheaded or blurred vision, no chest pain or palpitations during this episode.  He recently moved from [place] to join his daughter who works as a Engineer, civil (consulting) at [employer].  ED Course: Patient is a [place] [employer] veteran, has broken bone and repaired with some unknown metals in his legs in 1950s. MRI unable to perform. CTA: No large vessel occlusion. Focal severe stenosis of the left V2 vertebral artery without apparent distal flow limitation.  Significant intracranial atherosclerosis.  CT head:No acute intracranial hemorrhage or evidence of acute infarction.  5/11 AM  when working with PT OT became minimally responsive with eyes flickering, lasting about 15 seconds.  EEG ordered to assess for possible seizure, no epileptiform activity but showed focal left temporal slowing.  Neurology following.  03/08/20: Seen and examined.  He denies any symptoms while laying in bed.  Neurology started on new medications yesterday for suspected POTS  syndrome.    Assessment/Plan: Active Problems:   TIA (transient ischemic attack)   HTN (hypertension)   DM (diabetes mellitus), secondary, uncontrolled, with neurologic complications (HCC)   Possible complex partial seizure versus TIA versus near syncope versus POTS syndrome -Unable to perform MRI due to metal seen in his legs unclear if MRI compatible. -No large vessel occlusion on CTA head and neck. -Focal severe stenosis of the left V2 vertebral artery without apparent distal flow limitation. -Dysarthria-resolved -EEG showed focal left temporal slowing.  Second EEG 5/12 showed mild diffuse encephalopathy, nonspecific to etiology.  No seizures or epileptiform discharges were seen throughout the recording. -Continue Pletal and Zocor DC Keppra as recommended by neurology Recurrent transient episodes of dizziness and nearly passing out upon standing near syncope likely related to autonomic insufficiency-orthostatic intolerance versus POTS syndrome. Vertebrobasilar TIA or seizures are less likely Catheter angio 5/13 L ICA < 50% stenosis. Diffuse intracranial atherosclerosis w/ multifocal areas severe anterior and posterior stenoses   Suspected POTS syndrome Started on Ted hose, Florinef and gabapentin, continue Follow up with neurology outpatient Continue PT OT with assistance and fall precautions  Severe intracranial atherosclerosis Catheter angio L ICA < 50% stenosis. Diffuse intracranial atherosclerosis w/ multifocal areas severe anterior and posterior stenoses Continue statin and dual antiplatelet therapy, he is on aspirin and Pletal, as well as Zocor.  Suspected seizure activity/possible complex partial seizure -when working with PT OT became minimally responsive with eyes flickering, lasting about 15 seconds.  Eyes flickering have also been witnessed by his daughter who states they are becoming more frequent. EEG 5/11 showed left temporal slowing  Seen by neurology, discussed  with Dr. Pearlean Brownie,  Keppra discontinued.  Hypovolemic hyponatremia, improved with normal saline IV hydration -Likely from HCTZ, hold BP meds for now and allow permissive hypertension -Currently on normal saline at 75 cc/h x2 days -Serum Na+ trending up 133 from 128  HTN Gradually normalize blood pressure Continue to monitor vital signs -As needed hydralazine for now for BP more than 220/120   CKD IIIA Baseline cr 1.3 GFR 46 Continue to avoid nephrotoxins and hypotension  HLD C/w High intensity statin, Zocor 40 mg daily Continue fenofibrate  IDDM2 with hyperglycemia -a1C 7.5 on 03/05/20 Hold off oral hypoglycemics Continue Lantus 5 units daily and insulin sliding scale. Avoid hypoglycemia  Hypothyroid TSH normal. Continue Synthroid  BPH Continue tamsulosin Continue to monitor urine output  Chronic anxiety/depression- Stable Continue Zoloft  DVT prophylaxis: Heparin subcu TID Code Status: Full code Family Communication:  Updated daughter via phone 5/12.  Consults called: Neurology    Dispo: The patient is from:   ALF                       Anticipated d/c is to: SNF              Anticipated d/c date is: 03/09/20              Patient currently undergoing work up for neurological changes.        Objective: Vitals:   03/08/20 0037 03/08/20 0348 03/08/20 0849 03/08/20 1131  BP: 119/64 128/65 (!) 130/57 (!) 146/128  Pulse: 71 74 75 70  Resp: 16 19 19 20   Temp:  98 F (36.7 C) 98.7 F (37.1 C) 98.2 F (36.8 C)  TempSrc: Oral Oral Oral Oral  SpO2: 98% 100% 99% 100%  Weight:      Height:        Intake/Output Summary (Last 24 hours) at 03/08/2020 1433 Last data filed at 03/07/2020 1954 Gross per 24 hour  Intake 120 ml  Output 400 ml  Net -280 ml   Filed Weights   03/04/20 1100  Weight: 67.1 kg    Exam:  . General: 86 y.o. year-old male well-developed well-nourished in no acute distress.  Alert and interactive.   . Cardiovascular: Regular rate  and rhythm no rubs or gallops. 92 Respiratory: Clear to auscultation no wheezes or rales. . Abdomen: Soft nontender normal bowel sounds present. . Musculoskeletal: No lower extremity edema bilaterally.   Marland Kitchen Psychiatry: Mood is appropriate for condition and setting.  Data Reviewed: CBC: Recent Labs  Lab 03/04/20 1110 03/04/20 1112 03/07/20 0441 03/08/20 0331  WBC 7.1  --  5.8 7.6  NEUTROABS 4.7  --   --   --   HGB 12.9* 12.9* 11.2* 11.8*  HCT 39.0 38.0* 33.7* 36.0*  MCV 94.2  --  92.3 93.3  PLT 208  --  188 179   Basic Metabolic Panel: Recent Labs  Lab 03/04/20 1110 03/04/20 1110 03/04/20 1112 03/05/20 0253 03/06/20 0729 03/07/20 0441 03/08/20 0331  NA 130*   < > 128* 132* 133* 132* 133*  K 4.2   < > 4.1 3.7 3.8 4.2 3.8  CL 94*   < > 92* 100 101 98 104  CO2 23  --   --  26 27 26 22   GLUCOSE 259*   < > 252* 100* 94 103* 97  BUN 18   < > 20 16 17 17 16   CREATININE 1.52*   < > 1.50* 1.35* 1.37* 1.34* 1.34*  CALCIUM 9.5  --   --  8.6* 8.6* 8.4* 8.1*   < > = values in this interval not displayed.   GFR: Estimated Creatinine Clearance: 35.5 mL/min (A) (by C-G formula based on SCr of 1.34 mg/dL (H)). Liver Function Tests: Recent Labs  Lab 03/04/20 1110  AST 17  ALT 13  ALKPHOS 36*  BILITOT 1.3*  PROT 5.9*  ALBUMIN 3.4*   No results for input(s): LIPASE, AMYLASE in the last 168 hours. No results for input(s): AMMONIA in the last 168 hours. Coagulation Profile: Recent Labs  Lab 03/04/20 1110  INR 1.1   Cardiac Enzymes: No results for input(s): CKTOTAL, CKMB, CKMBINDEX, TROPONINI in the last 168 hours. BNP (last 3 results) No results for input(s): PROBNP in the last 8760 hours. HbA1C: No results for input(s): HGBA1C in the last 72 hours. CBG: Recent Labs  Lab 03/07/20 1216 03/07/20 1559 03/07/20 2122 03/08/20 0631 03/08/20 1133  GLUCAP 89 166* 146* 89 162*   Lipid Profile: No results for input(s): CHOL, HDL, LDLCALC, TRIG, CHOLHDL, LDLDIRECT in the  last 72 hours. Thyroid Function Tests: Recent Labs    03/07/20 1808  TSH 2.605   Anemia Panel: No results for input(s): VITAMINB12, FOLATE, FERRITIN, TIBC, IRON, RETICCTPCT in the last 72 hours. Urine analysis: No results found for: COLORURINE, APPEARANCEUR, LABSPEC, PHURINE, GLUCOSEU, HGBUR, BILIRUBINUR, KETONESUR, PROTEINUR, UROBILINOGEN, NITRITE, LEUKOCYTESUR Sepsis Labs: @LABRCNTIP (procalcitonin:4,lacticidven:4)  ) Recent Results (from the past 240 hour(s))  SARS Coronavirus 2 by RT PCR (hospital order, performed in [facility] hospital lab) Nasopharyngeal Nasopharyngeal Swab     Status: None   Collection Time: 03/04/20  4:00 PM   Specimen: Nasopharyngeal Swab  Result Value Ref Range Status   SARS Coronavirus 2 NEGATIVE NEGATIVE Final    Comment: (NOTE) SARS-CoV-2 target nucleic acids are NOT DETECTED. The SARS-CoV-2 RNA is generally detectable in upper and lower respiratory specimens during the acute phase of infection. The lowest concentration of SARS-CoV-2 viral copies this assay can detect is 250 copies / mL. A negative result does not preclude SARS-CoV-2 infection and should not be used as the sole basis for treatment or other patient management decisions.  A negative result may occur with improper specimen collection / handling, submission of specimen other than nasopharyngeal swab, presence of viral mutation(s) within the areas targeted by this assay, and inadequate number of viral copies (<250 copies / mL). A negative result must be combined with clinical observations, patient history, and epidemiological information. Fact Sheet for Patients:   05/04/20 Fact Sheet for Healthcare Providers: BoilerBrush.com.cy This test is not yet approved or cleared  by the https://pope.com/ FDA and has been authorized for detection and/or diagnosis of SARS-CoV-2 by FDA under an Emergency Use Authorization (EUA).  This EUA will  remain in effect (meaning this test can be used) for the duration of the COVID-19 declaration under Section 564(b)(1) of the Act, 21 U.S.C. section 360bbb-3(b)(1), unless the authorization is terminated or revoked sooner. Performed at [facility], [address], [place], [place]       Studies: No results found.  Scheduled Meds: . aspirin EC  81 mg Oral Daily  . cilostazol  50 mg Oral BID  . donepezil  10 mg Oral QHS  . fenofibrate  54 mg Oral Daily  . fludrocortisone  0.1 mg Oral Daily  . gabapentin  100 mg Oral BID  . heparin  5,000 Units Subcutaneous Q12H  . insulin aspart  0-9 Units Subcutaneous TID WC  . insulin glargine  5 Units Subcutaneous  QHS  . levothyroxine  50 mcg Oral QAC breakfast  . sertraline  50 mg Oral Daily  . simvastatin  40 mg Oral q1800  . tamsulosin  0.4 mg Oral Daily    Continuous Infusions: . sodium chloride 75 mL/hr at 03/08/20 1122     LOS: 3 days     Kayleen Memos, MD [facility] Pager [phone]  If 7PM-7AM, please contact night-coverage [email or web address] Password [facility] 03/08/2020, 2:33 PM

## 2020-03-08 NOTE — Progress Notes (Signed)
Occupational Therapy Treatment Patient Details Name: Joe Peters MRN: [id number] DOB: [birth year] Today's Date: 03/08/2020    History of present illness Pt is 86 yo male with PMH including HTN, hypelipidemia, PVD, ruptured AAA s/p repair, DM, neuropathy, and hypothyroidism. Pt came to ED with near syncopal episode with associated difficulty with word finding.  Pt with EEG that showed L temporal slowing but no indications of seizure activity.  CT head revealed no acute abnormality, pt unable to have MRI.  Pt with possible complex partial seizure vs hemodynamic TIA vs near syncope per neuro note.  Pt did have orthostatic hypotension earlier today 5/12 and scheduled for possible cerebral artirogram to evaluate vascular cause of syncope.   OT comments  Patient continues to make steady progress towards goals in skilled OT session. Patient's session encompassed co-treat with PT in order to progress further than EOB due to orthostatics. Pt placed in bilateral ACE bandages in addition to TED hose from toes to mid thigh in order to aid in BP. Pt able to sit EOB, stand and pivot to [facility], then pivot to recliner with steady increase in BP levels with continued movement. Due to increased days in bed, pt fatigued quickly, but encouraged OOB for meals and over weekend to further aid in activity tolerance. Will continue to follow acutely.    Follow Up Recommendations  SNF;Supervision/Assistance - 24 hour    Equipment Recommendations  None recommended by OT    Recommendations for Other Services      Precautions / Restrictions Precautions Precautions: Fall Precaution Comments: Watch BP Restrictions Weight Bearing Restrictions: No Other Position/Activity Restrictions: watch BP       Mobility Bed Mobility Overal bed mobility: Needs Assistance Bed Mobility: Supine to Sit     Supine to sit: Min guard     General bed mobility comments: cues for use of rail and sequencing; min guard for  safety  Transfers Overall transfer level: Needs assistance Equipment used: 2 person hand held assist;Rolling walker (2 wheeled) Transfers: Sit to/from [employer] Sit to Stand: Min assist;Mod assist;+2 physical assistance;+2 safety/equipment Stand pivot transfers: +2 safety/equipment;Min assist;+2 physical assistance       General transfer comment: initial stand from EOB and without RW pt unable to stand upright likely due to painful feet so RW used and pt able to achieve upright posture; assistance required for power up into standing from EOB and BSC and for balance and managing RW for SPT; cues for safe hand placement and sequencing     Balance Overall balance assessment: Needs assistance Sitting-balance support: Feet supported;No upper extremity supported Sitting balance-Leahy Scale: Fair     Standing balance support: Bilateral upper extremity supported;During functional activity Standing balance-Leahy Scale: Poor Standing balance comment: pt with posterior bias at times and tends to keep weight through heels while in standing                            ADL either performed or assessed with clinical judgement   ADL Overall ADL's : Needs assistance/impaired                         Toilet Transfer: Minimal assistance;+2 for physical assistance;Stand-pivot;BSC;RW Toilet Transfer Details (indicate cue type and reason): +2 for safety, increased time to maneuver feet to pivot to [facility] Toileting- Clothing Manipulation and Hygiene: Sit to/from stand;Cueing for compensatory techniques;Maximal assistance       Functional mobility during  ADLs: +2 for physical assistance;+2 for safety/equipment;Minimal assistance;Moderate assistance General ADL Comments: Pt with increased ability to complete ADLs and functional mobility after donning bilateral ACE bandages to aid in compression     Vision       Perception     Praxis      Cognition  Arousal/Alertness: Awake/alert Behavior During Therapy: WFL for tasks assessed/performed Overall Cognitive Status: History of cognitive impairments - at baseline                                 General Comments: WFL for mobility tasks during session        Exercises     Shoulder Instructions       General Comments      Pertinent Vitals/ Pain       Pain Assessment: Faces Faces Pain Scale: Hurts even more Pain Location: feet (L > R) Pain Descriptors / Indicators: Guarding;Grimacing;Sore;Tender Pain Intervention(s): Limited activity within patient's tolerance;Monitored during session;Repositioned  Home Living                                          Prior Functioning/Environment              Frequency  Min 2X/week        Progress Toward Goals  OT Goals(current goals can now be found in the care plan section)  Progress towards OT goals: Progressing toward goals  Acute Rehab OT Goals Patient Stated Goal: to get out of this bed OT Goal Formulation: With patient Time For Goal Achievement: 03/19/20 Potential to Achieve Goals: Good  Plan Discharge plan remains appropriate    Co-evaluation    PT/OT/SLP Co-Evaluation/Treatment: Yes Reason for Co-Treatment: Complexity of the patient's impairments (multi-system involvement);For patient/therapist safety PT goals addressed during session: Mobility/safety with mobility OT goals addressed during session: ADL's and self-care      AM-PAC OT "6 Clicks" Daily Activity     Outcome Measure   Help from another person eating meals?: A Little Help from another person taking care of personal grooming?: A Little Help from another person toileting, which includes using toliet, bedpan, or urinal?: A Lot Help from another person bathing (including washing, rinsing, drying)?: A Lot Help from another person to put on and taking off regular upper body clothing?: A Little Help from another person to  put on and taking off regular lower body clothing?: A Lot 6 Click Score: 15    End of Session Equipment Utilized During Treatment: Rolling walker  OT Visit Diagnosis: Unsteadiness on feet (R26.81)   Activity Tolerance Patient tolerated treatment well   Patient Left in chair;with call bell/phone within reach;with chair alarm set   Nurse Communication Mobility status        Time: 8295-6213 OT Time Calculation (min): 48 min  Charges: OT General Charges $OT Visit: 1 Visit OT Treatments $Self Care/Home Management : 23-37 mins  Corinne [place], [facility] [place] 03/08/2020, 2:29 PM

## 2020-03-08 NOTE — Progress Notes (Signed)
STROKE TEAM PROGRESS NOTE   INTERVAL HISTORY  Patient is sitting up in bed.  He has no complaints.  He was started on Florinef.  He is wearing compression stockings.  I spoke with the patient's daughter over the phone and explained the findings of orthostatic vital signs recording and the need to maintain adequate hydration and to orthostatic tolerance exercises.  If Florinef is not useful may need to consider switching him to Northera instead.  This medication is not on formulary at [facility] and I left a prescription for the daughter to take to his nursing home when he goes there  Vitals:   03/07/20 1953 03/08/20 0037 03/08/20 0348 03/08/20 0849  BP: (!) 146/59 119/64 128/65 (!) 130/57  Pulse: 66 71 74 75  Resp: 18 16 19 19   Temp: 98 F (36.7 C)  98 F (36.7 C) 98.7 F (37.1 C)  TempSrc: Oral Oral Oral Oral  SpO2: 99% 98% 100% 99%  Weight:      Height:        CBC:  Recent Labs  Lab 03/04/20 1110 03/04/20 1112 03/07/20 0441 03/08/20 0331  WBC 7.1   < > 5.8 7.6  NEUTROABS 4.7  --   --   --   HGB 12.9*   < > 11.2* 11.8*  HCT 39.0   < > 33.7* 36.0*  MCV 94.2   < > 92.3 93.3  PLT 208   < > 188 179   < > = values in this interval not displayed.    Basic Metabolic Panel:  Recent Labs  Lab 03/07/20 0441 03/08/20 0331  NA 132* 133*  K 4.2 3.8  CL 98 104  CO2 26 22  GLUCOSE 103* 97  BUN 17 16  CREATININE 1.34* 1.34*  CALCIUM 8.4* 8.1*   Lipid Panel:     Component Value Date/Time   CHOL 107 03/05/2020 0253   TRIG 47 03/05/2020 0253   HDL 50 03/05/2020 0253   CHOLHDL 2.1 03/05/2020 0253   VLDL 9 03/05/2020 0253   LDLCALC 48 03/05/2020 0253   HgbA1c:  Lab Results  Component Value Date   HGBA1C 7.5 (H) 03/05/2020    Alcohol Level     Component Value Date/Time   ETH <10 03/04/2020 1110    IMAGING past 24 hours IR 05/04/2020 Guide Vasc Access Right  Result Date: 03/07/2020 INDICATION: 86 year old male with recurrent episodes of dizziness and gait ataxia with 2 episodes  of fall. EXAM: BILATERAL COMMON CAROTID, BILATERAL SUBCLAVIAN, RIGHT INTERNAL CAROTID AND LEFT VERTEBRAL ARTERY ANGIOGRAMS MEDICATIONS: No antibiotics administered. ANESTHESIA/SEDATION: Versed 0.5 mg IV; Fentanyl 25 mcg IV Moderate Sedation Time:  58 The patient was continuously monitored during the procedure by the interventional radiology nurse under my direct supervision. FLUOROSCOPY TIME:  Fluoroscopy Time: 16 minutes 48 seconds (404 mGy). COMPLICATIONS: None immediate. TECHNIQUE: Informed written consent was obtained from the patient after a thorough discussion of the procedural risks, benefits and alternatives. All questions were addressed. Maximal Sterile Barrier Technique was utilized including caps, mask, sterile gowns, sterile gloves, sterile drape, hand hygiene and skin antiseptic. A timeout was performed prior to the initiation of the procedure. PROCEDURE: The right groin was prepped and draped in the usual sterile fashion. Real-time ultrasound guidance was utilized for vascular access including the acquisition of a permanent ultrasound image documenting patency of the accessed vessel. Using the modified Seldinger technique, transfemoral access into the right common femoral artery was obtained without difficulty. Over a 0.035 inch guidewire, a 5  French sheath was inserted. Through this, and also over 0.035 inch guidewire, a 5 [place] Berenstein 2 catheter was advanced to the aortic arch and selectively positioned in the right subclavian artery. Frontal and lateral angiograms of the neck were obtained. The catheter was then retracted and then advanced over the wire into the right common carotid artery. Frontal and lateral angiograms of the neck were obtained. Using biplane roadmap, the catheter was advanced into the right internal carotid artery. Frontal, lateral and bilateral magnified oblique angiograms of the head were obtained. The catheter was pulled into the aortic arch and then advanced into the  left subclavian artery. Frontal and lateral angiograms of the neck were obtained. The catheter was then advanced into the left vertebral artery. Frontal, lateral and bilateral magnified oblique views of the head were obtained. The catheter was then placed into the left common carotid artery. Frontal, lateral and bilateral oblique views of the neck were obtained. Following, frontal, lateral and bilateral magnified oblique views of the head were obtained. The catheter was subsequently withdrawn. Frontal and lateral angiograms of the right common femoral artery were obtained via sheath side port. A 5 [place] ExoSeal was utilized for access site closure. FINDINGS: Right subclavian artery angiograms: Mild luminal irregularity of the cervical right vertebral artery. There is no evidence of significant stenosis or dissection. Right common carotid artery angiograms: Mild atherosclerotic changes with calcified plaques in the right carotid bulb and minimal luminal irregularity of the cervical right ICA. There is no stenosis. Right internal carotid artery angiograms: Luminal irregularity seen in the cavernous segment of the right ICA, consistent with mild atherosclerotic disease without significant stenosis. Multifocal area of luminal irregularity consistent with intracranial atherosclerotic disease throughout the right ACA and MCA vascular trees with multiple areas of severe stenosis in the M2 and M3 segments, particularly in the superior division. No hemodynamically significant stenosis of the A1 or M1 segments. No occlusion, aneurysm, AVM or dural AVF. Major main and dural venous sinuses are patent. Left subclavian artery angiograms: There is increased tortuosity of the V2 segment of the left vertebral artery with 2 focal areas of less than 50% stenosis in the V2 segment. Left vertebral artery angiograms: Normal course and caliber of the intracranial left vertebral artery. The non dominant intracranial right vertebral artery  seen by contrast reflux in appear normal. The basilar artery has normal caliber. Multifocal areas of luminal irregularity and stenosis are seen throughout the bilateral PCA vascular trees with moderate stenosis at the proximal P1 and P2 segments on the right and severe stenosis at the P2 segment on the left. Focal severe stenosis is also seen in the left superior cerebellar artery. No aneurysm, AVM or dural AV fistula. Major veins and dural sinuses are patent. Left common carotid artery angiograms-cervical views: Prominent atherosclerotic plaques are seen in the left carotid bifurcation resulting in approximately 40% stenosis. Left common carotid artery angiogram-cranial views: The intracranial left ICA, left M1/MCA and A1/ACA have normal course and caliber. Multifocal areas of luminal irregularity is and focal stenoses are seen throughout the distal left ACA and MCA vascular trees with focal severe stenosis in the A3 segment and multiple focal severe stenosis in the M3 and M4 segments, particularly in the superior division. No aneurysm, AVM or dural AV fistula. The major draining vein and venous sinuses are patent. Left common femoral artery angiogram: The puncture is at the level of the common femoral artery. Arterial caliber is adequate for closure device utilization. IMPRESSION: 1. 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 2. Increased tortuosity of the cervical left vertebral artery without hemodynamically significant stenosis. 3. Prominent intracranial atherosclerotic disease with multifocal severe stenosis in the distal bilateral ACA, MCA and PCA vascular trees. No high-grade stenosis of the proximal intracranial arteries. Electronically Signed   By: Baldemar Lenis M.D.   On: 03/07/2020 15:02   IR ANGIO INTRA EXTRACRAN SEL INTERNAL CAROTID BILAT MOD SED  Result Date: 03/07/2020 INDICATION: 86 year old male with recurrent  episodes of dizziness and gait ataxia with 2 episodes of fall. EXAM: BILATERAL COMMON CAROTID, BILATERAL SUBCLAVIAN, RIGHT INTERNAL CAROTID AND LEFT VERTEBRAL ARTERY ANGIOGRAMS MEDICATIONS: No antibiotics administered. ANESTHESIA/SEDATION: Versed 0.5 mg IV; Fentanyl 25 mcg IV Moderate Sedation Time:  24 The patient was continuously monitored during the procedure by the interventional radiology nurse under my direct supervision. FLUOROSCOPY TIME:  Fluoroscopy Time: 16 minutes 48 seconds (404 mGy). COMPLICATIONS: None immediate. TECHNIQUE: Informed written consent was obtained from the patient after a thorough discussion of the procedural risks, benefits and alternatives. All questions were addressed. Maximal Sterile Barrier Technique was utilized including caps, mask, sterile gowns, sterile gloves, sterile drape, hand hygiene and skin antiseptic. A timeout was performed prior to the initiation of the procedure. PROCEDURE: The right groin was prepped and draped in the usual sterile fashion. Real-time ultrasound guidance was utilized for vascular access including the acquisition of a permanent ultrasound image documenting patency of the accessed vessel. Using the modified Seldinger technique, transfemoral access into the right common femoral artery was obtained without difficulty. Over a 0.035 inch guidewire, a 5 French sheath was inserted. Through this, and also over 0.035 inch guidewire, a 5 [place] Berenstein 2 catheter was advanced to the aortic arch and selectively positioned in the right subclavian artery. Frontal and lateral angiograms of the neck were obtained. The catheter was then retracted and then advanced over the wire into the right common carotid artery. Frontal and lateral angiograms of the neck were obtained. Using biplane roadmap, the catheter was advanced into the right internal carotid artery. Frontal, lateral and bilateral magnified oblique angiograms of the head were obtained. The catheter was  pulled into the aortic arch and then advanced into the left subclavian artery. Frontal and lateral angiograms of the neck were obtained. The catheter was then advanced into the left vertebral artery. Frontal, lateral and bilateral magnified oblique views of the head were obtained. The catheter was then placed into the left common carotid artery. Frontal, lateral and bilateral oblique views of the neck were obtained. Following, frontal, lateral and bilateral magnified oblique views of the head were obtained. The catheter was subsequently withdrawn. Frontal and lateral angiograms of the right common femoral artery were obtained via sheath side port. A 5 [place] ExoSeal was utilized for access site closure. FINDINGS: Right subclavian artery angiograms: Mild luminal irregularity of the cervical right vertebral artery. There is no evidence of significant stenosis or dissection. Right common carotid artery angiograms: Mild atherosclerotic changes with calcified plaques in the right carotid bulb and minimal luminal irregularity of the cervical right ICA. There is no stenosis. Right internal carotid artery angiograms: Luminal irregularity seen in the cavernous segment of the right ICA, consistent with mild atherosclerotic disease without significant stenosis. Multifocal area of luminal irregularity consistent with intracranial atherosclerotic disease throughout the right ACA and MCA vascular trees with multiple areas of severe stenosis in the M2 and M3 segments, particularly in the superior division. No hemodynamically significant stenosis  of the A1 or M1 segments. No occlusion, aneurysm, AVM or dural AVF. Major main and dural venous sinuses are patent. Left subclavian artery angiograms: There is increased tortuosity of the V2 segment of the left vertebral artery with 2 focal areas of less than 50% stenosis in the V2 segment. Left vertebral artery angiograms: Normal course and caliber of the intracranial left vertebral  artery. The non dominant intracranial right vertebral artery seen by contrast reflux in appear normal. The basilar artery has normal caliber. Multifocal areas of luminal irregularity and stenosis are seen throughout the bilateral PCA vascular trees with moderate stenosis at the proximal P1 and P2 segments on the right and severe stenosis at the P2 segment on the left. Focal severe stenosis is also seen in the left superior cerebellar artery. No aneurysm, AVM or dural AV fistula. Major veins and dural sinuses are patent. Left common carotid artery angiograms-cervical views: Prominent atherosclerotic plaques are seen in the left carotid bifurcation resulting in approximately 40% stenosis. Left common carotid artery angiogram-cranial views: The intracranial left ICA, left M1/MCA and A1/ACA have normal course and caliber. Multifocal areas of luminal irregularity is and focal stenoses are seen throughout the distal left ACA and MCA vascular trees with focal severe stenosis in the A3 segment and multiple focal severe stenosis in the M3 and M4 segments, particularly in the superior division. No aneurysm, AVM or dural AV fistula. The major draining vein and venous sinuses are patent. Left common femoral artery angiogram: The puncture is at the level of the common femoral artery. Arterial caliber is adequate for closure device utilization. IMPRESSION: 1. 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 2. Increased tortuosity of the cervical left vertebral artery without hemodynamically significant stenosis. 3. Prominent intracranial atherosclerotic disease with multifocal severe stenosis in the distal bilateral ACA, MCA and PCA vascular trees. No high-grade stenosis of the proximal intracranial arteries. Electronically Signed   By: Baldemar Lenis M.D.   On: 03/07/2020 15:02   IR ANGIO VERTEBRAL SEL SUBCLAVIAN INNOMINATE UNI R MOD SED  Result  Date: 03/07/2020 INDICATION: 86 year old male with recurrent episodes of dizziness and gait ataxia with 2 episodes of fall. EXAM: BILATERAL COMMON CAROTID, BILATERAL SUBCLAVIAN, RIGHT INTERNAL CAROTID AND LEFT VERTEBRAL ARTERY ANGIOGRAMS MEDICATIONS: No antibiotics administered. ANESTHESIA/SEDATION: Versed 0.5 mg IV; Fentanyl 25 mcg IV Moderate Sedation Time:  63 The patient was continuously monitored during the procedure by the interventional radiology nurse under my direct supervision. FLUOROSCOPY TIME:  Fluoroscopy Time: 16 minutes 48 seconds (404 mGy). COMPLICATIONS: None immediate. TECHNIQUE: Informed written consent was obtained from the patient after a thorough discussion of the procedural risks, benefits and alternatives. All questions were addressed. Maximal Sterile Barrier Technique was utilized including caps, mask, sterile gowns, sterile gloves, sterile drape, hand hygiene and skin antiseptic. A timeout was performed prior to the initiation of the procedure. PROCEDURE: The right groin was prepped and draped in the usual sterile fashion. Real-time ultrasound guidance was utilized for vascular access including the acquisition of a permanent ultrasound image documenting patency of the accessed vessel. Using the modified Seldinger technique, transfemoral access into the right common femoral artery was obtained without difficulty. Over a 0.035 inch guidewire, a 5 French sheath was inserted. Through this, and also over 0.035 inch guidewire, a 5 [place] Berenstein 2 catheter was advanced to the aortic arch and selectively positioned in the right subclavian artery. Frontal and lateral angiograms of the neck were obtained. The  catheter was then retracted and then advanced over the wire into the right common carotid artery. Frontal and lateral angiograms of the neck were obtained. Using biplane roadmap, the catheter was advanced into the right internal carotid artery. Frontal, lateral and bilateral magnified  oblique angiograms of the head were obtained. The catheter was pulled into the aortic arch and then advanced into the left subclavian artery. Frontal and lateral angiograms of the neck were obtained. The catheter was then advanced into the left vertebral artery. Frontal, lateral and bilateral magnified oblique views of the head were obtained. The catheter was then placed into the left common carotid artery. Frontal, lateral and bilateral oblique views of the neck were obtained. Following, frontal, lateral and bilateral magnified oblique views of the head were obtained. The catheter was subsequently withdrawn. Frontal and lateral angiograms of the right common femoral artery were obtained via sheath side port. A 5 [place] ExoSeal was utilized for access site closure. FINDINGS: Right subclavian artery angiograms: Mild luminal irregularity of the cervical right vertebral artery. There is no evidence of significant stenosis or dissection. Right common carotid artery angiograms: Mild atherosclerotic changes with calcified plaques in the right carotid bulb and minimal luminal irregularity of the cervical right ICA. There is no stenosis. Right internal carotid artery angiograms: Luminal irregularity seen in the cavernous segment of the right ICA, consistent with mild atherosclerotic disease without significant stenosis. Multifocal area of luminal irregularity consistent with intracranial atherosclerotic disease throughout the right ACA and MCA vascular trees with multiple areas of severe stenosis in the M2 and M3 segments, particularly in the superior division. No hemodynamically significant stenosis of the A1 or M1 segments. No occlusion, aneurysm, AVM or dural AVF. Major main and dural venous sinuses are patent. Left subclavian artery angiograms: There is increased tortuosity of the V2 segment of the left vertebral artery with 2 focal areas of less than 50% stenosis in the V2 segment. Left vertebral artery angiograms:  Normal course and caliber of the intracranial left vertebral artery. The non dominant intracranial right vertebral artery seen by contrast reflux in appear normal. The basilar artery has normal caliber. Multifocal areas of luminal irregularity and stenosis are seen throughout the bilateral PCA vascular trees with moderate stenosis at the proximal P1 and P2 segments on the right and severe stenosis at the P2 segment on the left. Focal severe stenosis is also seen in the left superior cerebellar artery. No aneurysm, AVM or dural AV fistula. Major veins and dural sinuses are patent. Left common carotid artery angiograms-cervical views: Prominent atherosclerotic plaques are seen in the left carotid bifurcation resulting in approximately 40% stenosis. Left common carotid artery angiogram-cranial views: The intracranial left ICA, left M1/MCA and A1/ACA have normal course and caliber. Multifocal areas of luminal irregularity is and focal stenoses are seen throughout the distal left ACA and MCA vascular trees with focal severe stenosis in the A3 segment and multiple focal severe stenosis in the M3 and M4 segments, particularly in the superior division. No aneurysm, AVM or dural AV fistula. The major draining vein and venous sinuses are patent. Left common femoral artery angiogram: The puncture is at the level of the common femoral artery. Arterial caliber is adequate for closure device utilization. IMPRESSION: 1. 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 2. Increased tortuosity of the cervical left vertebral artery without hemodynamically significant stenosis. 3. Prominent intracranial atherosclerotic disease with multifocal severe stenosis in the distal bilateral ACA, MCA and  PCA vascular trees. No high-grade stenosis of the proximal intracranial arteries. Electronically Signed   By: Baldemar LenisKatyucia  De Macedo Rodrigues M.D.   On: 03/07/2020 15:02   IR ANGIO  VERTEBRAL SEL VERTEBRAL UNI L MOD SED  Result Date: 03/07/2020 INDICATION: 86 year old male with recurrent episodes of dizziness and gait ataxia with 2 episodes of fall. EXAM: BILATERAL COMMON CAROTID, BILATERAL SUBCLAVIAN, RIGHT INTERNAL CAROTID AND LEFT VERTEBRAL ARTERY ANGIOGRAMS MEDICATIONS: No antibiotics administered. ANESTHESIA/SEDATION: Versed 0.5 mg IV; Fentanyl 25 mcg IV Moderate Sedation Time:  2649 The patient was continuously monitored during the procedure by the interventional radiology nurse under my direct supervision. FLUOROSCOPY TIME:  Fluoroscopy Time: 16 minutes 48 seconds (404 mGy). COMPLICATIONS: None immediate. TECHNIQUE: Informed written consent was obtained from the patient after a thorough discussion of the procedural risks, benefits and alternatives. All questions were addressed. Maximal Sterile Barrier Technique was utilized including caps, mask, sterile gowns, sterile gloves, sterile drape, hand hygiene and skin antiseptic. A timeout was performed prior to the initiation of the procedure. PROCEDURE: The right groin was prepped and draped in the usual sterile fashion. Real-time ultrasound guidance was utilized for vascular access including the acquisition of a permanent ultrasound image documenting patency of the accessed vessel. Using the modified Seldinger technique, transfemoral access into the right common femoral artery was obtained without difficulty. Over a 0.035 inch guidewire, a 5 French sheath was inserted. Through this, and also over 0.035 inch guidewire, a 5 [place] Berenstein 2 catheter was advanced to the aortic arch and selectively positioned in the right subclavian artery. Frontal and lateral angiograms of the neck were obtained. The catheter was then retracted and then advanced over the wire into the right common carotid artery. Frontal and lateral angiograms of the neck were obtained. Using biplane roadmap, the catheter was advanced into the right internal carotid artery.  Frontal, lateral and bilateral magnified oblique angiograms of the head were obtained. The catheter was pulled into the aortic arch and then advanced into the left subclavian artery. Frontal and lateral angiograms of the neck were obtained. The catheter was then advanced into the left vertebral artery. Frontal, lateral and bilateral magnified oblique views of the head were obtained. The catheter was then placed into the left common carotid artery. Frontal, lateral and bilateral oblique views of the neck were obtained. Following, frontal, lateral and bilateral magnified oblique views of the head were obtained. The catheter was subsequently withdrawn. Frontal and lateral angiograms of the right common femoral artery were obtained via sheath side port. A 5 [place] ExoSeal was utilized for access site closure. FINDINGS: Right subclavian artery angiograms: Mild luminal irregularity of the cervical right vertebral artery. There is no evidence of significant stenosis or dissection. Right common carotid artery angiograms: Mild atherosclerotic changes with calcified plaques in the right carotid bulb and minimal luminal irregularity of the cervical right ICA. There is no stenosis. Right internal carotid artery angiograms: Luminal irregularity seen in the cavernous segment of the right ICA, consistent with mild atherosclerotic disease without significant stenosis. Multifocal area of luminal irregularity consistent with intracranial atherosclerotic disease throughout the right ACA and MCA vascular trees with multiple areas of severe stenosis in the M2 and M3 segments, particularly in the superior division. No hemodynamically significant stenosis of the A1 or M1 segments. No occlusion, aneurysm, AVM or dural AVF. Major main and dural venous sinuses are patent. Left subclavian artery angiograms: There is increased tortuosity of the V2 segment of the left vertebral artery with 2 focal areas of  less than 50% stenosis in the V2  segment. Left vertebral artery angiograms: Normal course and caliber of the intracranial left vertebral artery. The non dominant intracranial right vertebral artery seen by contrast reflux in appear normal. The basilar artery has normal caliber. Multifocal areas of luminal irregularity and stenosis are seen throughout the bilateral PCA vascular trees with moderate stenosis at the proximal P1 and P2 segments on the right and severe stenosis at the P2 segment on the left. Focal severe stenosis is also seen in the left superior cerebellar artery. No aneurysm, AVM or dural AV fistula. Major veins and dural sinuses are patent. Left common carotid artery angiograms-cervical views: Prominent atherosclerotic plaques are seen in the left carotid bifurcation resulting in approximately 40% stenosis. Left common carotid artery angiogram-cranial views: The intracranial left ICA, left M1/MCA and A1/ACA have normal course and caliber. Multifocal areas of luminal irregularity is and focal stenoses are seen throughout the distal left ACA and MCA vascular trees with focal severe stenosis in the A3 segment and multiple focal severe stenosis in the M3 and M4 segments, particularly in the superior division. No aneurysm, AVM or dural AV fistula. The major draining vein and venous sinuses are patent. Left common femoral artery angiogram: The puncture is at the level of the common femoral artery. Arterial caliber is adequate for closure device utilization. IMPRESSION: 1. 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 2. Increased tortuosity of the cervical left vertebral artery without hemodynamically significant stenosis. 3. Prominent intracranial atherosclerotic disease with multifocal severe stenosis in the distal bilateral ACA, MCA and PCA vascular trees. No high-grade stenosis of the proximal intracranial arteries. Electronically Signed   By: Baldemar Lenis M.D.   On: 03/07/2020 15:02    PHYSICAL EXAM    Pleasant elderly Caucasian male not in distress.  He is has decreased hearing bilaterally. . Afebrile. Head is nontraumatic. Neck is supple without bruit.    Cardiac exam no murmur or gallop. Lungs are clear to auscultation. Distal pulses are well felt. Neurological Exam ;  Awake  Alert oriented x 3. Normal speech and language.  Diminished attention, registration and recall..Eye movements full without nystagmus.fundi were not visualized. Vision acuity and fields appear normal. Hearing is normal. Palatal movements are normal. Face symmetric. Tongue midline. Normal strength, tone, reflexes and coordination. Normal sensation. Gait deferred.  ASSESSMENT/PLAN Mr. Joe Peters is a 86 y.o. male with history of diabetes, BPH, hypothyroidism, urinary retention, aortic aneurysm with endovascular stent graft, depression, hypercholesterolemia, dementia and possible TIAs in the past presenting with dizziness, gait instability and slurred speech.   Recurrent transient episodes of dizziness and nearly passing out upon standing near syncope likely related to autonomic insufficiency-orthostatic intolerance versus POTS syndrome.  Versus Shy-Drager syndrome given history of dementia and autonomic insufficiency.. Vertebrobasilar TIA or seizures are less likely  CT head No acute abnormality. Small vessel disease. Atrophy. ASPECTS 10.     CTA head & neck no LVO. Proximal L ICA < 50% stenosis. L V2 focal severe stenosis. Significant atherosclerosis throughout.   Repeat CT at 24h no change  2D Echo EF 55-60%. No source of embolus   Catheter angio L ICA < 50% stenosis. Diffuse intracranial atherosclerosis w/ multifocal areas severe anterior and posterior stenoses   EEG focal left temporal 3 to 4 Hz slowing  Repeat EEG mild generalized slowing  Keppra discontinued 03/07/2020  LDL 48  HgbA1c 7.5  Heparin 5000 units sq q12h  for VTE  prophylaxis  pletal prior to admission, now on aspirin 81 mg daily and pletal. Continue at d/c.   Therapy recommendations:  SNF (currently at ALF level, may be able to provide appropriate care vs SNF)  Disposition:  pending   Severe intracranial atherosclerosis   Catheter angio L ICA < 50% stenosis. Diffuse intracranial atherosclerosis w/ multifocal areas severe anterior and posterior stenoses   Hypertension Orthostatic hypotensioin  Stable . BP goal normotensive . TED hose  Hyperlipidemia  Home meds:  zocor 51  Now on lipitor 80  LDL 48, at goal < 70  Ok to continue home statin, continue at discharge  Diabetes type II Uncontrolled  HgbA1c 7.5, goal < 7.0  Other Stroke Risk Factors  Advanced age  Hx AAA s/p stent graft on pletal  Hx TIA per hx, no documentation in EPIC  Other Active Problems  CKD IIIa  hypothyroid on synthroid   BPH on tamsulosin  Anxiety/depression  Hospital day # 3  Continue Florinef.  Orthostatic tolerance exercises.  Physical therapy to mobilize out of bed.  If Florinef is not effective may consider switching to Northera 100 mg 3 times daily during daytime.  Long discussion with the patient daughter Shirlean Mylar over the phone and answered questions.  Discharge to rehab/SNF for the next few days.  Greater than 50% time during this 25-minute visit was spent on counseling and coordination of care and discussion with patient, family and care team and answering questions.  Stroke team will sign off.  Kindly call for questions.  Antony Contras, MD Medical Director [place] Pager: [phone] 03/08/2020 10:08 AM   To contact Stroke Continuity provider, please refer to [email or web address] After hours, contact General Neurology

## 2020-03-08 NOTE — TOC Progression Note (Addendum)
Transition of Care [facility]) - Progression Note    Patient Details  Name: Ollin Hochmuth MRN: [id number] Date of Birth: 1927-11-30  Transition of Care [facility]) CM/SW Contact  Nonda Lou, [place] Phone Number: 03/08/2020, 12:28 PM  Clinical Narrative:    Update: Daughter expressed first SNF choice would be [facility] if patient is unable to return to [facility] independently.   CSW spoke with patient's daughter Zella Ball to discuss discharge plan. Daughter expressed [facility] will not be able to provide 24 hour supervision and she believes more long-term care will eventually be needed. CSW  provided daughter with bed offers and SNF ratings.    Expected Discharge Plan: Skilled Nursing Facility Barriers to Discharge: Continued Medical Work up  Expected Discharge Plan and Services Expected Discharge Plan: Skilled Nursing Facility In-house Referral: Clinical Social Work Discharge Planning Services: CM Consult Post Acute Care Choice: Skilled Nursing Facility Living arrangements for the past 2 months: Apartment                                       Social Determinants of Health (SDOH) Interventions    Readmission Risk Interventions No flowsheet data found.

## 2020-03-09 ENCOUNTER — Other Ambulatory Visit: Payer: Self-pay

## 2020-03-09 ENCOUNTER — Encounter (HOSPITAL_COMMUNITY): Payer: Self-pay | Admitting: Internal Medicine

## 2020-03-09 LAB — BASIC METABOLIC PANEL
Anion gap: 6 (ref 5–15)
BUN: 13 mg/dL (ref 8–23)
CO2: 24 mmol/L (ref 22–32)
Calcium: 7.8 mg/dL — ABNORMAL LOW (ref 8.9–10.3)
Chloride: 101 mmol/L (ref 98–111)
Creatinine, Ser: 1.14 mg/dL (ref 0.61–1.24)
GFR calc Af Amer: >60 mL/min (ref >60)
GFR calc non Af Amer: 57 mL/min — ABNORMAL LOW (ref >60)
Glucose, Bld: 182 mg/dL — ABNORMAL HIGH (ref 70–99)
Potassium: 3.6 mmol/L (ref 3.5–5.1)
Sodium: 131 mmol/L — ABNORMAL LOW (ref 135–145)

## 2020-03-09 LAB — GLUCOSE, CAPILLARY
Glucose-Capillary: 143 mg/dL — ABNORMAL HIGH (ref 70–99)
Glucose-Capillary: 169 mg/dL — ABNORMAL HIGH (ref 70–99)
Glucose-Capillary: 185 mg/dL — ABNORMAL HIGH (ref 70–99)
Glucose-Capillary: 119 mg/dL — ABNORMAL HIGH (ref 70–99)

## 2020-03-09 LAB — SARS CORONAVIRUS 2 (TAT 6-24 HRS): SARS Coronavirus 2: NEGATIVE

## 2020-03-09 MED ORDER — FLUDROCORTISONE ACETATE 0.1 MG PO TABS: 0.1000 mg | ORAL_TABLET | Freq: Every day | ORAL | 0 refills | Status: AC

## 2020-03-09 MED ORDER — ASPIRIN 81 MG PO TBEC: 81.0000 mg | DELAYED_RELEASE_TABLET | Freq: Every day | ORAL | 0 refills | Status: DC

## 2020-03-09 MED ORDER — ADULT MULTIVITAMIN W/MINERALS CH
1.0000 | ORAL_TABLET | Freq: Every day | ORAL | Status: DC
  Administered 2020-03-09 – 2020-03-10 (×2): 1 via ORAL
  Filled 2020-03-09 (×2): qty 1

## 2020-03-09 MED ORDER — ENSURE MAX PROTEIN PO LIQD: 11.0000 [oz_av] | Freq: Two times a day (BID) | ORAL | 0 refills | Status: AC

## 2020-03-09 MED ORDER — ENSURE MAX PROTEIN PO LIQD
11.0000 [oz_av] | Freq: Two times a day (BID) | ORAL | Status: DC
  Administered 2020-03-09 – 2020-03-10 (×2): 11 [oz_av] via ORAL
  Filled 2020-03-09 (×4): qty 330

## 2020-03-09 MED ORDER — ADULT MULTIVITAMIN W/MINERALS CH: 1.0000 | ORAL_TABLET | Freq: Every day | ORAL | 0 refills | Status: AC

## 2020-03-09 NOTE — TOC Progression Note (Signed)
Transition of Care [facility]) - Progression Note    Patient Details  Name: Hendrik Donath MRN: [id number] Date of Birth: August 13, 1928  Transition of Care [facility]) CM/SW [place], [place] Phone Number: 03/09/2020, 2:29 PM  Clinical Narrative:    CSW notified that patietnt is medically stable for discharge. CSW met bedside with patient and patient's daughter Shirlean Mylar. Daughter expressed a preference for [facility] for short-term SNF.  Daughter inquired on long-term SNF placement, CSW agreed to reach out to [facility] to provide daughters contact information. Unable to make contact with [facility] for referral.  CSW spoke with [facility] and confirmed patient can discharge to facility once covid has resulted.    Expected Discharge Plan: [place] Barriers to Discharge: Continued Medical Work up  Expected Discharge Plan and Services Expected Discharge Plan: [place] In-house Referral: Clinical Social Work Discharge Planning Services: CM Consult Post Acute Care Choice: [place] Living arrangements for the past 2 months: Apartment                                       Social Determinants of Health (SDOH) Interventions    Readmission Risk Interventions No flowsheet data found.

## 2020-03-09 NOTE — Plan of Care (Signed)
Discussed with patient plan of care for the evening, pain management and need for ortho static vital signs with some teach back displayed at this time.

## 2020-03-09 NOTE — Discharge Instructions (Signed)
Near-Syncope °Near-syncope is when you suddenly get weak or dizzy, or you feel like you might pass out (faint). This may also be called presyncope. This is due to a lack of blood flow to the brain. During an episode of near-syncope, you may: °· Feel dizzy, weak, or light-headed. °· Feel sick to your stomach (nauseous). °· See all white or all black. °· See spots. °· Have cold, clammy skin. °This condition is caused by a sudden decrease in blood flow to the brain. This decrease can result from various causes, but most of those causes are not dangerous. However, near-syncope may be a sign of a serious medical problem, so it is important to seek medical care. °Follow these instructions at home: °Medicines °· Take over-the-counter and prescription medicines only as told by your doctor. °· If you are taking blood pressure or heart medicine, get up slowly and spend many minutes getting ready to sit and then stand. This can help with dizziness. °General instructions °· Be aware of any changes in your symptoms. °· Talk with your doctor about your symptoms. You may need to have testing to find the cause of your near-syncope. °· If you start to feel like you might pass out, lie down right away. Raise (elevate) your feet above the level of your heart. Breathe deeply and steadily. Wait until all of the symptoms are gone. °· Have someone stay with you until you feel stable. °· Do not drive, use machinery, or play sports until your doctor says it is okay. °· Drink enough fluid to keep your pee (urine) pale yellow. °· Keep all follow-up visits as told by your doctor. This is important. °Get help right away if you: °· Have a seizure. °· Have pain in your: °? Chest. °? Belly (abdomen). °? Back. °· Faint once or more than once. °· Have a very bad headache. °· Are bleeding from your mouth or butt. °· Have black or tarry poop (stool). °· Have a very fast or uneven heartbeat (palpitations). °· Are mixed up (confused). °· Have trouble  walking. °· Are very weak. °· Have trouble seeing. °These symptoms may be an emergency. Do not wait to see if the symptoms will go away. Get medical help right away. Call your local emergency services (911 in the [place] Do not drive yourself to the hospital. °Summary °· Near-syncope is when you suddenly get weak or dizzy, or you feel like you might pass out (faint). °· This condition is caused by a lack of blood flow to the brain. °· Near-syncope may be a sign of a serious medical problem, so it is important to seek medical care. °This information is not intended to replace advice given to you by your health care provider. Make sure you discuss any questions you have with your health care provider. °Document Revised: 02/03/2019 Document Reviewed: 08/31/2018 °Elsevier Patient Education © 2020 [employer] ° ° °Orthostatic Hypotension °Blood pressure is a measurement of how strongly, or weakly, your blood is pressing against the walls of your arteries. Orthostatic hypotension is a sudden drop in blood pressure that happens when you quickly change positions, such as when you get up from sitting or lying down. °Arteries are blood vessels that carry blood from your heart throughout your body. When blood pressure is too low, you may not get enough blood to your brain or to the rest of your organs. This can cause weakness, light-headedness, rapid heartbeat, and fainting. This can last for just a few seconds or   for up to a few minutes. Orthostatic hypotension is usually not a serious problem. However, if it happens frequently or gets worse, it may be a sign of something more serious. °What are the causes? °This condition may be caused by: °· Sudden changes in posture, such as standing up quickly after you have been sitting or lying down. °· Blood loss. °· Loss of body fluids (dehydration). °· Heart problems. °· Hormone (endocrine) problems. °· Pregnancy. °· Severe infection. °· Lack of certain nutrients. °· Severe allergic  reactions (anaphylaxis). °· Certain medicines, such as blood pressure medicine or medicines that make the body lose excess fluids (diuretics). Sometimes, this condition can be caused by not taking medicine as directed, such as taking too much of a certain medicine. °What increases the risk? °The following factors may make you more likely to develop this condition: °· Age. Risk increases as you get older. °· Conditions that affect the heart or the central nervous system. °· Taking certain medicines, such as blood pressure medicine or diuretics. °· Being pregnant. °What are the signs or symptoms? °Symptoms of this condition may include: °· Weakness. °· Light-headedness. °· Dizziness. °· Blurred vision. °· Fatigue. °· Rapid heartbeat. °· Fainting, in severe cases. °How is this diagnosed? °This condition is diagnosed based on: °· Your medical history. °· Your symptoms. °· Your blood pressure measurement. Your health care provider will check your blood pressure when you are: °? Lying down. °? Sitting. °? Standing. °A blood pressure reading is recorded as two numbers, such as "120 over 80" (or 120/80). The first ("top") number is called the systolic pressure. It is a measure of the pressure in your arteries as your heart beats. The second ("bottom") number is called the diastolic pressure. It is a measure of the pressure in your arteries when your heart relaxes between beats. Blood pressure is measured in a unit called mm Hg. Healthy blood pressure for most adults is 120/80. If your blood pressure is below 90/60, you may be diagnosed with hypotension. °Other information or tests that may be used to diagnose orthostatic hypotension include: °· Your other vital signs, such as your heart rate and temperature. °· Blood tests. °· Tilt table test. For this test, you will be safely secured to a table that moves you from a lying position to an upright position. Your heart rhythm and blood pressure will be monitored during the  test. °How is this treated? °This condition may be treated by: °· Changing your diet. This may involve eating more salt (sodium) or drinking more water. °· Taking medicines to raise your blood pressure. °· Changing the dosage of certain medicines you are taking that might be lowering your blood pressure. °· Wearing compression stockings. These stockings help to prevent blood clots and reduce swelling in your legs. °In some cases, you may need to go to the hospital for: °· Fluid replacement. This means you will receive fluids through an IV. °· Blood replacement. This means you will receive donated blood through an IV (transfusion). °· Treating an infection or heart problems, if this applies. °· Monitoring. You may need to be monitored while medicines that you are taking wear off. °Follow these instructions at home: °Eating and drinking ° °· Drink enough fluid to keep your urine pale yellow. °· Eat a healthy diet, and follow instructions from your health care provider about eating or drinking restrictions. A healthy diet includes: °? Fresh fruits and vegetables. °? Whole grains. °? Lean meats. °? Low-fat dairy products. °·   Eat extra salt only as directed. Do not add extra salt to your diet unless your health care provider told you to do that. °· Eat frequent, small meals. °· Avoid standing up suddenly after eating. °Medicines °· Take over-the-counter and prescription medicines only as told by your health care provider. °? Follow instructions from your health care provider about changing the dosage of your current medicines, if this applies. °? Do not stop or adjust any of your medicines on your own. °General instructions ° °· Wear compression stockings as told by your health care provider. °· Get up slowly from lying down or sitting positions. This gives your blood pressure a chance to adjust. °· Avoid hot showers and excessive heat as directed by your health care provider. °· Return to your normal activities as told  by your health care provider. Ask your health care provider what activities are safe for you. °· Do not use any products that contain nicotine or tobacco, such as cigarettes, e-cigarettes, and chewing tobacco. If you need help quitting, ask your health care provider. °· Keep all follow-up visits as told by your health care provider. This is important. °Contact a health care provider if you: °· Vomit. °· Have diarrhea. °· Have a fever for more than 2-3 days. °· Feel more thirsty than usual. °· Feel weak and tired. °Get help right away if you: °· Have chest pain. °· Have a fast or irregular heartbeat. °· Develop numbness in any part of your body. °· Cannot move your arms or your legs. °· Have trouble speaking. °· Become sweaty or feel light-headed. °· Faint. °· Feel short of breath. °· Have trouble staying awake. °· Feel confused. °Summary °· Orthostatic hypotension is a sudden drop in blood pressure that happens when you quickly change positions. °· Orthostatic hypotension is usually not a serious problem. °· It is diagnosed by having your blood pressure taken lying down, sitting, and then standing. °· It may be treated by changing your diet or adjusting your medicines. °This information is not intended to replace advice given to you by your health care provider. Make sure you discuss any questions you have with your health care provider. °Document Revised: 04/07/2018 Document Reviewed: 04/07/2018 °Elsevier Patient Education © 2020 [employer]. ° °

## 2020-03-09 NOTE — Progress Notes (Signed)
PROGRESS NOTE  Joe Peters HUD:[id number] DOB: [birth year] DOA: 03/04/2020 PCP: Martha Clan, MD  HPI/Recap of past 24 hours:  Joe Peters is a 86 y.o. male with medical history significant of HTN, IDDM, PVD, diabetic neuropathy, AAA rupture status post repair, HLD, hypothyroidism, brought in for code stroke.  Patient was at assisted living facility, has chronic ambulation dysfunction using a walker intermittently.    The morning of presentation during a regular physical therapy, staff noticed patient suddenly stopped walking and became very wobbly and about to fall, meantime seemed was unable to verbalize for few minutes. They were able to hold him and no fall happened. Patient remembered as he felt like he was about to fall, and he wanted to ask for help but could not talk despite normal understanding and forming ideas in his mind. EMS on route noticed patient has intermittent slurred speech.  Patient denies any lightheaded or blurred vision, no chest pain or palpitations during this episode.  He recently moved from [place] to join his daughter who works as a Engineer, civil (consulting) at [employer].  ED Course: Patient is a [place] [employer] veteran, has broken bone and repaired with some unknown metals in his legs in 1950s. MRI unable to perform. CTA: No large vessel occlusion. Focal severe stenosis of the left V2 vertebral artery without apparent distal flow limitation.  Significant intracranial atherosclerosis.  CT head: No acute intracranial hemorrhage or evidence of acute infarction.  5/11 AM  when working with PT OT became minimally responsive with eyes flickering, lasting about 15 seconds.  EEG ordered to assess for possible seizure, no epileptiform activity but showed focal left temporal slowing.  Seen by Neurology with suspected POTS syndrome versus autonomic dysfunction.  Started on Florinef, gabapentin and compression stockings on 5/14.  03/09/20:  Seen and examined.  Orthostatics improving  on florinef.  Reports burning sensation in his feet when he stands.  Improved with gabapentin, continue.      Assessment/Plan: Active Problems:   TIA (transient ischemic attack)   HTN (hypertension)   DM (diabetes mellitus), secondary, uncontrolled, with neurologic complications (HCC)   Suspected POTS syndrome versus autonomic dysfunction Initially concern for TIA versus complex seizures, work up unrevealing. -Unable to perform MRI due to metal seen in his legs unclear if MRI compatible. -No large vessel occlusion on CTA head and neck. -Focal severe stenosis of the left V2 vertebral artery without apparent distal flow limitation. -Dysarthria-resolved -EEG showed focal left temporal slowing.  Second EEG 5/12 showed mild diffuse encephalopathy, nonspecific to etiology.  No seizures or epileptiform discharges were seen throughout the recording. -Continue Pletal and Zocor DC Keppra as recommended by neurology Recurrent transient episodes of dizziness and nearly passing out upon standing near syncope likely related to autonomic insufficiency-orthostatic intolerance versus POTS syndrome. Vertebrobasilar TIA or seizures are less likely Catheter angio 5/13 L ICA < 50% stenosis. Diffuse intracranial atherosclerosis w/ multifocal areas severe anterior and posterior stenoses   He was started on Florinef, gabapentin and compression stockings, his orthostatic vital signs are improving. Continue PT OT with assistance and fall precaution Continue out of bed to chair every shift  Continue ambulation as tolerated  Suspected POTS syndrome Started on Ted hose, Florinef and gabapentin, continue Follow up with neurology outpatient Continue PT OT with assistance and fall precautions  Severe intracranial atherosclerosis Catheter angio L ICA < 50% stenosis. Diffuse intracranial atherosclerosis w/ multifocal areas severe anterior and posterior stenoses Continue statin and dual antiplatelet therapy, he is  on aspirin  and Pletal, as well as Zocor.  Resolved AKI on CKD 3A Creatinine up to 1.5 on 03/04/2020 Creatinine continues to improve 1.1 with GFR of 57, appears to be at his baseline Continue to avoid nephrotoxins, hypotension and urinary retention. Continue to monitor urine output  Suspected seizure activity/possible complex partial seizure -when working with PT OT became minimally responsive with eyes flickering, lasting about 15 seconds.  Eyes flickering have also been witnessed by his daughter who states they are becoming more frequent. EEG 5/11 showed left temporal slowing  Seen by neurology, discussed with Dr. Pearlean Brownie,  Keppra discontinued.  Hypovolemic hyponatremia Encourage oral intake instead of fluid intravascularly Continue to hold off HCTZ Serum sodium 131 Asymptomatic at this time Continue to monitor  HTN Gradually normalize blood pressure Continue to monitor vital signs -As needed hydralazine for now for BP more than 220/120   CKD IIIA Baseline cr 1.3 GFR 46 Continue to avoid nephrotoxins and hypotension  HLD C/w High intensity statin, Zocor 40 mg daily Continue fenofibrate  IDDM2 with hyperglycemia likely exacerbated by steroids -a1C 7.5 on 03/05/20 Continue to hold off oral hypoglycemics Continue Lantus 5 units daily and insulin sliding scale. Continue insulin sliding scale  Diabetes peripheral neuropathy Continue gabapentin  Hypothyroid TSH normal. Continue Synthroid  BPH with urinary retention Continue tamsulosin Continue in and out cath 3 times daily Continue to monitor urine output  Chronic anxiety/depression- Stable Continue Zoloft  Ambulatory dysfunction PT OT assessment recommended SNF. TOC assisting with SNF placement Continue PT OT with assistance of for precautions.  Moderate protein calorie malnutrition  Albumin 3.4 Loss of muscle mass BMI 20 Dietitian consulted to assist with daily caloric requirements Continue to encourage  oral intake Continue oral supplements  DVT prophylaxis: Heparin subcu TID Code Status: Full code Family Communication:  Updated daughter via phone 5/14.  Consults called: Neurology    Dispo: The patient is from:   ALF                       Anticipated d/c is to: SNF              Anticipated d/c date is: 03/10/20              Patient currently stable for DC, pending SNF placement.      Objective: Vitals:   03/09/20 0400 03/09/20 0500 03/09/20 0600 03/09/20 0848  BP:    (!) 118/55  Pulse:    66  Resp: 15 15 18 14   Temp:    98.6 F (37 C)  TempSrc:      SpO2:    99%  Weight:      Height:        Intake/Output Summary (Last 24 hours) at 03/09/2020 1226 Last data filed at 03/09/2020 1226 Gross per 24 hour  Intake 3784.28 ml  Output 1475 ml  Net 2309.28 ml   Filed Weights   03/04/20 1100  Weight: 67.1 kg    Exam:  . General: 86 y.o. year-old male well-developed well-nourished in no acute distress.  Alert and interactive. . Cardiovascular: Regular rate and rhythm no rubs or gallops. 92 Respiratory: Clear to auscultation no wheezes or rales.   . Abdomen: Soft nontender normal bowel sounds present.   . Musculoskeletal: No lower extremity edema bilaterally.   Marland Kitchen Psychiatry: Mood is appropriate for condition  Data Reviewed: CBC: Recent Labs  Lab 03/04/20 1110 03/04/20 1112 03/07/20 0441 03/08/20 0331  WBC 7.1  --  5.8  7.6  NEUTROABS 4.7  --   --   --   HGB 12.9* 12.9* 11.2* 11.8*  HCT 39.0 38.0* 33.7* 36.0*  MCV 94.2  --  92.3 93.3  PLT 208  --  188 179   Basic Metabolic Panel: Recent Labs  Lab 03/05/20 0253 03/06/20 0729 03/07/20 0441 03/08/20 0331 03/09/20 0323  NA 132* 133* 132* 133* 131*  K 3.7 3.8 4.2 3.8 3.6  CL 100 101 98 104 101  CO2 26 27 26 22 24   GLUCOSE 100* 94 103* 97 182*  BUN 16 17 17 16 13   CREATININE 1.35* 1.37* 1.34* 1.34* 1.14  CALCIUM 8.6* 8.6* 8.4* 8.1* 7.8*   GFR: Estimated Creatinine Clearance: 41.7 mL/min (by C-G formula  based on SCr of 1.14 mg/dL). Liver Function Tests: Recent Labs  Lab 03/04/20 1110  AST 17  ALT 13  ALKPHOS 36*  BILITOT 1.3*  PROT 5.9*  ALBUMIN 3.4*   No results for input(s): LIPASE, AMYLASE in the last 168 hours. No results for input(s): AMMONIA in the last 168 hours. Coagulation Profile: Recent Labs  Lab 03/04/20 1110  INR 1.1   Cardiac Enzymes: No results for input(s): CKTOTAL, CKMB, CKMBINDEX, TROPONINI in the last 168 hours. BNP (last 3 results) No results for input(s): PROBNP in the last 8760 hours. HbA1C: No results for input(s): HGBA1C in the last 72 hours. CBG: Recent Labs  Lab 03/08/20 1133 03/08/20 1626 03/08/20 2026 03/09/20 0641 03/09/20 1148  GLUCAP 162* 219* 227* 143* 169*   Lipid Profile: No results for input(s): CHOL, HDL, LDLCALC, TRIG, CHOLHDL, LDLDIRECT in the last 72 hours. Thyroid Function Tests: Recent Labs    03/07/20 1808  TSH 2.605   Anemia Panel: No results for input(s): VITAMINB12, FOLATE, FERRITIN, TIBC, IRON, RETICCTPCT in the last 72 hours. Urine analysis: No results found for: COLORURINE, APPEARANCEUR, LABSPEC, PHURINE, GLUCOSEU, HGBUR, BILIRUBINUR, KETONESUR, PROTEINUR, UROBILINOGEN, NITRITE, LEUKOCYTESUR Sepsis Labs: @LABRCNTIP (procalcitonin:4,lacticidven:4)  ) Recent Results (from the past 240 hour(s))  SARS Coronavirus 2 by RT PCR (hospital order, performed in [facility] hospital lab) Nasopharyngeal Nasopharyngeal Swab     Status: None   Collection Time: 03/04/20  4:00 PM   Specimen: Nasopharyngeal Swab  Result Value Ref Range Status   SARS Coronavirus 2 NEGATIVE NEGATIVE Final    Comment: (NOTE) SARS-CoV-2 target nucleic acids are NOT DETECTED. The SARS-CoV-2 RNA is generally detectable in upper and lower respiratory specimens during the acute phase of infection. The lowest concentration of SARS-CoV-2 viral copies this assay can detect is 250 copies / mL. A negative result does not preclude SARS-CoV-2  infection and should not be used as the sole basis for treatment or other patient management decisions.  A negative result may occur with improper specimen collection / handling, submission of specimen other than nasopharyngeal swab, presence of viral mutation(s) within the areas targeted by this assay, and inadequate number of viral copies (<250 copies / mL). A negative result must be combined with clinical observations, patient history, and epidemiological information. Fact Sheet for Patients:   Fact Sheet for Healthcare Providers: [facility] This test is not yet approved or cleared  by the 05/04/20 FDA and has been authorized for detection and/or diagnosis of SARS-CoV-2 by FDA under an Emergency Use Authorization (EUA).  This EUA will remain in effect (meaning this test can be used) for the duration of the COVID-19 declaration under Section 564(b)(1) of the Act, 21 U.S.C. section 360bbb-3(b)(1), unless the authorization is terminated or revoked sooner. Performed  at [facility], [place] [address], [place], [place] [address]       Studies: No results found.  Scheduled Meds: . aspirin EC  81 mg Oral Daily  . cilostazol  50 mg Oral BID  . donepezil  10 mg Oral QHS  . fenofibrate  54 mg Oral Daily  . fludrocortisone  0.1 mg Oral Daily  . gabapentin  100 mg Oral BID  . heparin  5,000 Units Subcutaneous Q12H  . insulin aspart  0-9 Units Subcutaneous TID WC  . insulin glargine  5 Units Subcutaneous QHS  . levothyroxine  50 mcg Oral QAC breakfast  . multivitamin with minerals  1 tablet Oral Daily  . Ensure Max Protein  11 oz Oral BID  . sertraline  50 mg Oral Daily  . simvastatin  40 mg Oral q1800  . tamsulosin  0.4 mg Oral Daily    Continuous Infusions:    LOS: 4 days     Kayleen Memos, MD [facility] Pager [phone]  If 7PM-7AM, please contact  night-coverage [email or web address] Password [facility] 03/09/2020, 12:26 PM

## 2020-03-09 NOTE — Progress Notes (Signed)
Initial Nutrition Assessment  DOCUMENTATION CODES:   Not applicable  INTERVENTION:   Ensure Max po BID, each supplement provides 150 kcal and 30 grams of protein.   MVI with minerals daily.  NUTRITION DIAGNOSIS:   Increased nutrient needs related to (suspected malnutrition with recent 20.2% weight loss) as evidenced by estimated needs.  GOAL:   Patient will meet greater than or equal to 90% of their needs  MONITOR:   PO intake, Supplement acceptance, Labs  REASON FOR ASSESSMENT:   Consult Assessment of nutrition requirement/status  ASSESSMENT:   86 yo male admitted with suspected POTS syndrome. PMH includes HTN, IDDM, PVD, diabetic neuropathy, HLD, hypothyroidism.   Spoke with patient's daughter over the phone. She reports that patient weighed 185-190 lbs 6-12 months ago. He was living in [place] and often forgot to eat due to dementia. He moved to [place] ~4 months ago to be closer to his daughter. He currently lives in a nursing facility. He has been drinking protein shakes at facility and weight has stabilized. Since admission, he has been eating well. He consumes 100% of meals if he likes what he is sent. Daughter is assisting patient with ordering and feeding meals.   20.2% weight loss within the past 6-12 months is significant for the time frame. Suspect some degree of malnutrition; unable to obtain enough information at this time for identification of malnutrition.   Labs reviewed. Sodium 131 (L) CBG's: 227-143  Medications reviewed and include novolog, lantus, flomax.   NUTRITION - FOCUSED PHYSICAL EXAM:  unable to complete  Diet Order:   Diet Order            Diet heart healthy/carb modified Room service appropriate? Yes; Fluid consistency: Thin  Diet effective now              EDUCATION NEEDS:   No education needs have been identified at this time  Skin:  Skin Assessment: Reviewed RN Assessment  Last BM:  5/9  Height:   Ht Readings from Last 1  Encounters:  03/04/20 5\' 11"  (1.803 m)    Weight:   Wt Readings from Last 1 Encounters:  03/04/20 67.1 kg    Ideal Body Weight:  78.2 kg  BMI:  Body mass index is 20.63 kg/m.  Estimated Nutritional Needs:   Kcal:  2000-2200  Protein:  100-115 gm  Fluid:  >/= 2 L    05/04/20, RD, LDN, CNSC Please refer to Amion for contact information.

## 2020-03-10 LAB — GLUCOSE, CAPILLARY
Glucose-Capillary: 109 mg/dL — ABNORMAL HIGH (ref 70–99)
Glucose-Capillary: 254 mg/dL — ABNORMAL HIGH (ref 70–99)

## 2020-03-10 NOTE — Accreditation Note (Addendum)
Attempted to obtain orthostatic vitals on pt. Pt politely declined ; pt stated" felt to weak"

## 2020-03-10 NOTE — Discharge Summary (Signed)
Discharge Summary  Joe Peters EAV:[id number] DOB: 25-Mar-1928  PCP: Martha Clan, MD  Admit date: 03/04/2020 Discharge date: 03/10/2020  Time spent: 35 minutes   Recommendations for Outpatient Follow-up:  1. Follow-up with neurology within 1 week 2. Follow-up with your PCP in 1 to 2 weeks 3. Follow-up with your cardiologist 4. Continue physical therapy with assistance and fall precautions 5. Take your medications as prescribed. 6. Continue in and out cath to avoid urinary retention.  Discharge Diagnoses:  Active Hospital Problems   Diagnosis Date Noted  . TIA (transient ischemic attack) 03/04/2020  . HTN (hypertension) 03/04/2020  . DM (diabetes mellitus), secondary, uncontrolled, with neurologic complications (HCC) 03/04/2020    Resolved Hospital Problems  No resolved problems to display.    Discharge Condition: Stable  Diet recommendation: Heart healthy, carb modified diet  Vitals:   03/10/20 0423 03/10/20 0813  BP: (!) 143/67 125/65  Pulse: 74 82  Resp: 15 18  Temp: 98.3 F (36.8 C) 97.6 F (36.4 C)  SpO2: 98% 97%    History of present illness:  Joe Peters a 86 y.o.malewith medical history significant ofHTN, IDDM2,PVD, diabetic neuropathy, AAA rupture status post repair,HLD,hypothyroidism, brought in for code stroke. Patient was at assisted living facility, has chronic ambulation dysfunction using a walker intermittently. The morning of his presentation during a routinephysical therapy, staff at the facility noticed that he suddenlystoppedwalking and became very wobbly and about to fall, meantime seemedwas unable to verbalize for few minutes. They were able to hold him and no fall happened.Patient remembered feeling like he was about to fall, and he wanted to ask for help but could not talkdespite normal understanding and forming ideas inhis mind. EMSon route noticed patient had intermittent slurred speech. Patient denied any  lightheadedness or blurry vision, no chest pain or palpitations during this episode. He recently moved from [place] to join his daughter who works as a Engineer, civil (consulting) at [employer].  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 MRI unable to perform.  CT head: No acute intracranial hemorrhage or evidence of acute infarction.  CTA head and neck showed No large vessel occlusion.  03/05/20 AM  when working with OT became minimally responsive with eyes flickering, lasting about 15 seconds.  EEG done and showed, no epileptiform activity but showed focal left temporal slowing.  Repeated EEG showed mild diffuse encephalopathy, nonspecific to etiology.   Seen by Neurology, suspected POTS syndrome versus autonomic dysfunction.  Started on Florinef and compression stockings on 03/08/20.    Last orthosthatic vital signs improved.  This was completed on 03/09/20.  Patient declined orthosthatic vital signs today.   03/10/20: Seen and examined at his bedside.  No acute events overnight.  He has no new complaints.  Patient will be discharged to SNF to continue physical therapy.  Continue 24-hour supervision/assistance and fall precautions.     Hospital Course:  Active Problems:   TIA (transient ischemic attack)   HTN (hypertension)   DM (diabetes mellitus), secondary, uncontrolled, with neurologic complications (HCC)   Suspected POTS syndrome versus autonomic dysfunction Initially there was a concern for TIA versus complex seizures, work up was unrevealing. -Unable to perform MRI due to metal seen in his legs unclear if the metals are MRI compatible. -No large vessel occlusion on CTA head and neck. -Focal severe stenosis of the left V2 vertebral artery without apparent distal flow limitation. -Dysarthria-resolved -EEG showed focal left temporal slowing.  Second EEG 5/12 showed mild  diffuse encephalopathy, nonspecific to etiology.  No seizures or  epileptiform discharges were seen throughout the recording. -Continue aspirin, Pletal and Zocor as recommended by neurology DC Keppra as recommended by neurology Transient episodes of dizziness and nearly passing out upon standing,near syncopelikely related to autonomic insufficiency-orthostatic intolerance versusPOTSsyndrome. Vertebrobasilar TIA or seizures are less likely per neurology. Catheter angio 03/07/20 L ICA < 50% stenosis. Diffuse intracranial atherosclerosis w/ multifocal areas severe anterior and posterior stenoses  He was started on Florinef and compression stockings, his orthostatic vital signs are improving. Continue PT OT with assistance and fall precautions at SNF Follow-up with neurology outpatient  Severe intracranial atherosclerosis Catheter angio L ICA < 50% stenosis. Diffuse intracranial atherosclerosis w/ multifocal areas severe anterior and posterior stenoses Continue statin and dual antiplatelet therapy, he is on aspirin and Pletal, as well as Zocor.  Resolved AKI on CKD 3A Creatinine up to 1.5 on 03/04/2020 Creatinine improved 1.1 with GFR of 57 Continue to avoid nephrotoxins and urinary retention.  Chronic urinary retention Continue straight in and out cath 3 times daily Follow-up with urology outpatient If you do not have a urologist, obtain referral from your primary care provider.  Less likely seizure activity/possible complex partial seizure -when working with PT OT became minimally responsive with eyes flickering, lasting about 15 seconds.  Eyes flickering have also been witnessed by his daughter who states they are becoming more frequent. No evidence of epileptiform EEG x2. Keppra was started then discontinued by neurology. Follow-up with neurology outpatient  Hypovolemic hyponatremia Encourage oral intake Continue to hold off HCTZ Serum sodium 131 Follow-up with your PCP  HTN Blood pressure is at goal. Follow-up with your PCP or  neurology.  HLD LDL 48, at goal, goal less than 70. Continue home Zocor and fenofibrate  IDDM2 with hyperglycemia likely exacerbated by steroids -a1C 7.5 on 03/05/20 Resume home regimen and follow-up with your PCP  Diabetes peripheral neuropathy Continue home gabapentin 100 mg twice daily  Hypothyroid TSH normal. Continue Synthroid  BPH with urinary retention Continue tamsulosin Continue in and out cath 3 times daily  Chronic anxiety/depression- Stable Continue Zoloft  Ambulatory dysfunction PT OT assessment recommended SNF. TOC assisting with SNF placement Continue PT OT with assistance of for precautions.  Moderate protein calorie malnutrition  Albumin 3.4 and loss of muscle mass BMI 20 Dietitian consulted to assist with daily caloric requirements Continue to encourage oral intake Continue oral supplements   Code Status:Full code   Consults called:Neurology   Discharge Exam: BP 125/65 (BP Location: Left Arm)   Pulse 82   Temp 97.6 F (36.4 C) (Oral)   Resp 18   Ht 5\' 11"  (1.803 m)   Wt 67.1 kg   SpO2 97%   BMI 20.63 kg/m  . General: 86 y.o. year-old male well developed well nourished in no acute distress.  Alert and interactive. . Cardiovascular: Regular rate and rhythm with no rubs or gallops.  Marland Kitchen. Respiratory: Clear to auscultation with no wheezes or rales.  . Abdomen: Soft nontender nondistended with normal bowel sounds x4 quadrants. . Musculoskeletal: No lower extremity edema bilaterally. Marland Kitchen. Psychiatry: Mood is appropriate for condition and setting  Discharge Instructions You were cared for by a hospitalist during your hospital stay. If you have any questions about your discharge medications or the care you received while you were in the hospital after you are discharged, you can call the unit and asked to speak with the hospitalist on call if the hospitalist that took care of you  is not available. Once you are discharged, your primary care  physician will handle any further medical issues. Please note that NO REFILLS for any discharge medications will be authorized once you are discharged, as it is imperative that you return to your primary care physician (or establish a relationship with a primary care physician if you do not have one) for your aftercare needs so that they can reassess your need for medications and monitor your lab values.   Allergies as of 03/10/2020      Reactions   Penicillins Other (See Comments)   Reaction unknown -- occurred in childhood   Sulfa Antibiotics Other (See Comments)   Reaction unknown -- occurred in childhood      Medication List    STOP taking these medications   losartan-hydrochlorothiazide 100-25 MG tablet Commonly known as: HYZAAR     TAKE these medications   aspirin 81 MG EC tablet Take 1 tablet (81 mg total) by mouth daily.   cilostazol 50 MG tablet Commonly known as: PLETAL Take 50 mg by mouth 2 (two) times daily.   donepezil 10 MG tablet Commonly known as: ARICEPT Take 10 mg by mouth at bedtime.   Ensure Max Protein Liqd Take 330 mLs (11 oz total) by mouth 2 (two) times daily for 7 days.   fenofibrate 54 MG tablet Take 54 mg by mouth daily.   fludrocortisone 0.1 MG tablet Commonly known as: FLORINEF Take 1 tablet (0.1 mg total) by mouth daily.   gabapentin 100 MG capsule Commonly known as: NEURONTIN Take 100 mg by mouth 2 (two) times daily.   insulin glargine 100 unit/mL Sopn Commonly known as: LANTUS Inject 10 Units into the skin at bedtime.   metFORMIN 500 MG tablet Commonly known as: GLUCOPHAGE Take 500 mg by mouth in the morning and at bedtime.   multivitamin with minerals Tabs tablet Take 1 tablet by mouth daily.   sertraline 50 MG tablet Commonly known as: ZOLOFT Take 50 mg by mouth daily.   simvastatin 40 MG tablet Commonly known as: ZOCOR Take 40 mg by mouth daily.   Synthroid 50 MCG tablet Generic drug: levothyroxine Take 50 mcg by mouth  daily before breakfast.   tamsulosin 0.4 MG Caps capsule Commonly known as: FLOMAX Take 0.4 mg by mouth daily.      Allergies  Allergen Reactions  . Penicillins Other (See Comments)    Reaction unknown -- occurred in childhood  . Sulfa Antibiotics Other (See Comments)    Reaction unknown -- occurred in childhood    Contact information for follow-up providers    Martha Clan, MD. Call in 1 day(s).   Specialty: Internal Medicine Why: Please call for a post hospital follow up appointment. Contact information: [address] [place] [address] [phone]        Micki Riley, MD. Call in 1 day(s).   Specialties: Neurology, Radiology Why: Please call for a post hospital follow up appointment. Contact information: [address] [place] [address] [phone]            Contact information for after-discharge care    Destination    [facility] .   Service: Skilled Nursing Contact information: [address] [place] [address] [phone]                   The results of significant diagnostics from this hospitalization (including imaging, microbiology, ancillary and laboratory) are listed below for reference.  Significant Diagnostic Studies: EEG  Result Date: 03/05/2020 Charlsie Quest, MD     03/05/2020  4:55 PM Patient Name: Naitik Hermann MRN: [id number] Epilepsy Attending: Charlsie Quest Referring Physician/Provider: Dr Dow Adolph Date: 03/05/2020 Duration: 23.38 mins Patient history: 86yo M who presented with dizziness, gait instability and slurred speech. EEG to evaluate for seizure Level of alertness: awake, drowsy AEDs during EEG study: Gabapentin Technical aspects: This EEG study was done with scalp electrodes positioned according to the 10-20 International system of electrode placement. Electrical activity was acquired at a sampling rate of 500Hz  and reviewed with a high  frequency filter of 70Hz  and a low frequency filter of 1Hz . EEG data were recorded continuously and digitally stored. DESCRIPTION:: The posterior dominant rhythm consists of 8 Hz activity of moderate voltage (25-35 uV) seen predominantly in posterior head regions, symmetric and reactive to eye opening and eye closing.        Drowsiness was characterized by attenuation of the posterior background rhythm. Intermittent left temporal 2-3Hz  delta slowing was also noted. Hyperventilation and photic stimulation were not performed. ABNORMALITY - Intermittent slow, left temporal region IMPRESSION: This study is suggestive of non specific left temporal dysfunction. No seizures or epileptiform discharges were seen throughout the recording. Priyanka Annabelle Harman   CT ANGIO HEAD W OR WO CONTRAST  Result Date: 03/04/2020 CLINICAL DATA:  Code stroke follow-up EXAM: CT ANGIOGRAPHY HEAD AND NECK TECHNIQUE: Multidetector CT imaging of the head and neck was performed using the standard protocol during bolus administration of intravenous contrast. Multiplanar CT image reconstructions and MIPs were obtained to evaluate the vascular anatomy. Carotid stenosis measurements (when applicable) are obtained utilizing NASCET criteria, using the distal internal carotid diameter as the denominator. CONTRAST:  50mL OMNIPAQUE IOHEXOL 350 MG/ML SOLN COMPARISON:  None. FINDINGS: CTA NECK FINDINGS Aortic arch: Calcified and noncalcified plaque along the arch. Great vessel origins are patent. Circumferential plaque at the left common carotid origin causing less than 50% stenosis. Right carotid system: Patent. Minimal calcified plaque at the ICA origin without measurable stenosis. Left carotid system: Patent. Calcified and noncalcified plaque along the proximal internal carotid causing less than 50% stenosis. Vertebral arteries: Patent. Plaque at the right vertebral origin causes mild stenosis. There is focal severe stenosis of the left vertebral artery  at the C3-C4 level. Otherwise mild multifocal irregularity related to cervical spine degenerative changes. Skeleton: Degenerative changes of the cervical spine Other neck: No mass or adenopathy. Upper chest: No apical lung mass. Review of the MIP images confirms the above findings CTA HEAD FINDINGS Anterior circulation: Intracranial internal carotid arteries are patent with mild calcified plaque. Anterior cerebral arteries are patent with anterior communicating artery present. Tandem moderate to marked stenoses of the right A2 ACA. Irregularity of the left A3 ACA with focal marked stenosis. Middle cerebral arteries are patent. Moderate to marked stenosis of the distal left M1 MCA extending into M2 branch with diminished flow. Additional multifocal bilateral distal MCA stenoses. Posterior circulation: Intracranial vertebral arteries, basilar artery, and posterior cerebral arteries are patent. Atherosclerotic irregularity of both posterior cerebral arteries with multiple moderate and marked stenoses of the P2 segments Venous sinuses: As permitted by contrast timing, patent. Review of the MIP images confirms the above findings IMPRESSION: No large vessel occlusion. Plaque at the proximal left ICA causing less than 50% stenosis. No measurable stenosis at the right ICA origin. Focal severe stenosis of the left V2 vertebral artery without apparent distal flow limitation. Significant intracranial atherosclerosis as detailed above involving  anterior and posterior circulations. Preliminary results were communicated to Dr. Amada Jupiter at 11:31 amon 5/10/2021by text page via the [facility] messaging system. Electronically Signed   By: Guadlupe Spanish M.D.   On: 03/04/2020 11:53   DG Chest 2 View  Result Date: 03/04/2020 CLINICAL DATA:  Slurred speech, dizziness EXAM: CHEST - 2 VIEW COMPARISON:  None. FINDINGS: Heart size is normal. Atherosclerotic calcification of the aortic knob. Prior descending aortic stent graft. There is a  well-circumscribed 6.4 cm ovoid density adjacent to the mid descending thoracic aorta, which could potentially reflect an excluded aneurysm sac given the presence of the adjacent stent graft. No focal airspace consolidation, pleural effusion, or pneumothorax. No acute osseous findings. IMPRESSION: Smoothly marginated 6.4 cm ovoid density adjacent to the mid descending thoracic aorta, which could potentially reflect an excluded aneurysm sac given the presence of the adjacent stent graft. Further evaluation with contrast-enhanced chest CT is recommended if there are no available outside prior studies. Electronically Signed   By: Duanne Guess D.O.   On: 03/04/2020 13:50   CT HEAD WO CONTRAST  Result Date: 03/05/2020 CLINICAL DATA:  Follow-up stroke presentation. Dysarthria and facial droop. EXAM: CT HEAD WITHOUT CONTRAST TECHNIQUE: Contiguous axial images were obtained from the base of the skull through the vertex without intravenous contrast. COMPARISON:  CT studies 03/04/2020 FINDINGS: Brain: Generalized atrophy. No focal abnormality affects the brainstem or cerebellum. Cerebral hemispheres show small vessel change of the white matter. No cortical or large vessel territory infarction. No sign of acute infarction. No mass lesion, hemorrhage, hydrocephalus or extra-axial collection. Vascular: There is atherosclerotic calcification of the major vessels at the base of the brain. Skull: Negative Sinuses/Orbits: Clear/normal Other: None IMPRESSION: No change since yesterday. Brain atrophy. Chronic small-vessel change of the hemispheric white matter. No sign of acute or subacute infarction by CT. Electronically Signed   By: Paulina Fusi M.D.   On: 03/05/2020 12:52   CT ANGIO NECK W OR WO CONTRAST  Result Date: 03/04/2020 CLINICAL DATA:  Code stroke follow-up EXAM: CT ANGIOGRAPHY HEAD AND NECK TECHNIQUE: Multidetector CT imaging of the head and neck was performed using the standard protocol during bolus  administration of intravenous contrast. Multiplanar CT image reconstructions and MIPs were obtained to evaluate the vascular anatomy. Carotid stenosis measurements (when applicable) are obtained utilizing NASCET criteria, using the distal internal carotid diameter as the denominator. CONTRAST:  50mL OMNIPAQUE IOHEXOL 350 MG/ML SOLN COMPARISON:  None. FINDINGS: CTA NECK FINDINGS Aortic arch: Calcified and noncalcified plaque along the arch. Great vessel origins are patent. Circumferential plaque at the left common carotid origin causing less than 50% stenosis. Right carotid system: Patent. Minimal calcified plaque at the ICA origin without measurable stenosis. Left carotid system: Patent. Calcified and noncalcified plaque along the proximal internal carotid causing less than 50% stenosis. Vertebral arteries: Patent. Plaque at the right vertebral origin causes mild stenosis. There is focal severe stenosis of the left vertebral artery at the C3-C4 level. Otherwise mild multifocal irregularity related to cervical spine degenerative changes. Skeleton: Degenerative changes of the cervical spine Other neck: No mass or adenopathy. Upper chest: No apical lung mass. Review of the MIP images confirms the above findings CTA HEAD FINDINGS Anterior circulation: Intracranial internal carotid arteries are patent with mild calcified plaque. Anterior cerebral arteries are patent with anterior communicating artery present. Tandem moderate to marked stenoses of the right A2 ACA. Irregularity of the left A3 ACA with focal marked stenosis. Middle cerebral arteries are patent. Moderate to marked stenosis  of the distal left M1 MCA extending into M2 branch with diminished flow. Additional multifocal bilateral distal MCA stenoses. Posterior circulation: Intracranial vertebral arteries, basilar artery, and posterior cerebral arteries are patent. Atherosclerotic irregularity of both posterior cerebral arteries with multiple moderate and marked  stenoses of the P2 segments Venous sinuses: As permitted by contrast timing, patent. Review of the MIP images confirms the above findings IMPRESSION: No large vessel occlusion. Plaque at the proximal left ICA causing less than 50% stenosis. No measurable stenosis at the right ICA origin. Focal severe stenosis of the left V2 vertebral artery without apparent distal flow limitation. Significant intracranial atherosclerosis as detailed above involving anterior and posterior circulations. Preliminary results were communicated to Dr. Amada Jupiter at 11:31 amon 5/10/2021by text page via the [facility] messaging system. Electronically Signed   By: Guadlupe Spanish M.D.   On: 03/04/2020 11:53   IR US Guide Vasc Access Right  Result Date: 03/07/2020 INDICATION: 87 year old male with recurrent episodes of dizziness and gait ataxia with 2 episodes of fall. EXAM: BILATERAL COMMON CAROTID, BILATERAL SUBCLAVIAN, RIGHT INTERNAL CAROTID AND LEFT VERTEBRAL ARTERY ANGIOGRAMS MEDICATIONS: No antibiotics administered. ANESTHESIA/SEDATION: Versed 0.5 mg IV; Fentanyl 25 mcg IV Moderate Sedation Time:  66 The patient was continuously monitored during the procedure by the interventional radiology nurse under my direct supervision. FLUOROSCOPY TIME:  Fluoroscopy Time: 16 minutes 48 seconds (404 mGy). COMPLICATIONS: None immediate. TECHNIQUE: Informed written consent was obtained from the patient after a thorough discussion of the procedural risks, benefits and alternatives. All questions were addressed. Maximal Sterile Barrier Technique was utilized including caps, mask, sterile gowns, sterile gloves, sterile drape, hand hygiene and skin antiseptic. A timeout was performed prior to the initiation of the procedure. PROCEDURE: The right groin was prepped and draped in the usual sterile fashion. Real-time ultrasound guidance was utilized for vascular access including the acquisition of a permanent ultrasound image documenting patency of the  accessed vessel. Using the modified Seldinger technique, transfemoral access into the right common femoral artery was obtained without difficulty. Over a 0.035 inch guidewire, a 5 French sheath was inserted. Through this, and also over 0.035 inch guidewire, a 5 [place] Berenstein 2 catheter was advanced to the aortic arch and selectively positioned in the right subclavian artery. Frontal and lateral angiograms of the neck were obtained. The catheter was then retracted and then advanced over the wire into the right common carotid artery. Frontal and lateral angiograms of the neck were obtained. Using biplane roadmap, the catheter was advanced into the right internal carotid artery. Frontal, lateral and bilateral magnified oblique angiograms of the head were obtained. The catheter was pulled into the aortic arch and then advanced into the left subclavian artery. Frontal and lateral angiograms of the neck were obtained. The catheter was then advanced into the left vertebral artery. Frontal, lateral and bilateral magnified oblique views of the head were obtained. The catheter was then placed into the left common carotid artery. Frontal, lateral and bilateral oblique views of the neck were obtained. Following, frontal, lateral and bilateral magnified oblique views of the head were obtained. The catheter was subsequently withdrawn. Frontal and lateral angiograms of the right common femoral artery were obtained via sheath side port. A 5 [place] ExoSeal was utilized for access site closure. FINDINGS: Right subclavian artery angiograms: Mild luminal irregularity of the cervical right vertebral artery. There is no evidence of significant stenosis or dissection. Right common carotid artery angiograms: Mild atherosclerotic changes with calcified plaques in the right carotid bulb and minimal  luminal irregularity of the cervical right ICA. There is no stenosis. Right internal carotid artery angiograms: Luminal irregularity seen in  the cavernous segment of the right ICA, consistent with mild atherosclerotic disease without significant stenosis. Multifocal area of luminal irregularity consistent with intracranial atherosclerotic disease throughout the right ACA and MCA vascular trees with multiple areas of severe stenosis in the M2 and M3 segments, particularly in the superior division. No hemodynamically significant stenosis of the A1 or M1 segments. No occlusion, aneurysm, AVM or dural AVF. Major main and dural venous sinuses are patent. Left subclavian artery angiograms: There is increased tortuosity of the V2 segment of the left vertebral artery with 2 focal areas of less than 50% stenosis in the V2 segment. Left vertebral artery angiograms: Normal course and caliber of the intracranial left vertebral artery. The non dominant intracranial right vertebral artery seen by contrast reflux in appear normal. The basilar artery has normal caliber. Multifocal areas of luminal irregularity and stenosis are seen throughout the bilateral PCA vascular trees with moderate stenosis at the proximal P1 and P2 segments on the right and severe stenosis at the P2 segment on the left. Focal severe stenosis is also seen in the left superior cerebellar artery. No aneurysm, AVM or dural AV fistula. Major veins and dural sinuses are patent. Left common carotid artery angiograms-cervical views: Prominent atherosclerotic plaques are seen in the left carotid bifurcation resulting in approximately 40% stenosis. Left common carotid artery angiogram-cranial views: The intracranial left ICA, left M1/MCA and A1/ACA have normal course and caliber. Multifocal areas of luminal irregularity is and focal stenoses are seen throughout the distal left ACA and MCA vascular trees with focal severe stenosis in the A3 segment and multiple focal severe stenosis in the M3 and M4 segments, particularly in the superior division. No aneurysm, AVM or dural AV fistula. The major draining  vein and venous sinuses are patent. Left common femoral artery angiogram: The puncture is at the level of the common femoral artery. Arterial caliber is adequate for closure device utilization. IMPRESSION: 1. 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 2. Increased tortuosity of the cervical left vertebral artery without hemodynamically significant stenosis. 3. Prominent intracranial atherosclerotic disease with multifocal severe stenosis in the distal bilateral ACA, MCA and PCA vascular trees. No high-grade stenosis of the proximal intracranial arteries. Electronically Signed   By: Baldemar Lenis M.D.   On: 03/07/2020 15:02   EEG adult  Result Date: 03/07/2020 Charlsie Quest, MD     03/07/2020  8:49 AM Patient Name: Wesam Gearhart MRN: [id number] Epilepsy Attending: Charlsie Quest Referring Physician/Provider: Dr Delia Heady Date: 03/06/2020 Duration: 24.20 mins Patient history: 86yo M who has had multiple daily episodes when he stands up his face becomes pale and he is staring briefly unresponsive and tends to fall down.  She is never noticed any lipsmacking or tongue bite but yesterday he had an episode in which the nurse witness fluttering of his eyelids and some trembling of his legs when he stood up. EEG to evaluate for seizure Level of alertness: awake, asleep AEDs during EEG study: None Technical aspects: This EEG study was done with scalp electrodes positioned according to the 10-20 International system of electrode placement. Electrical activity was acquired at a sampling rate of 500Hz  and reviewed with a high frequency filter of 70Hz  and a low frequency filter of 1Hz . EEG data were recorded continuously and digitally stored. DESCRIPTION: The posterior  dominant rhythm consists of 8 Hz activity of moderate voltage (25-35 uV) seen predominantly in posterior head regions, symmetric and reactive to eye opening and eye  closing. Sleep was characterized by vertex waves, sleep spindles (12-14Hz ), maximal frontocentral. EEG also showed intermittent generalized polymorphic 3-6Hz  theta-delta slowing. Hyperventilation and photic stimulation were not performed. ABNORMALITY - Intermittent slow, generalized IMPRESSION: This study is suggestive of mild diffuse encephalopathy, non specific to etiology. No seizures or epileptiform discharges were seen throughout the recording. Charlsie Quest   ECHOCARDIOGRAM COMPLETE  Result Date: 03/04/2020    ECHOCARDIOGRAM REPORT   Patient Name:   KAYLOR SIMENSON Date of Exam: 03/04/2020 Medical Rec #:  [id number]        Height:       70.0 in Accession #:    [id number]       Weight:       147.9 lb Date of Birth:  1928/09/02        BSA:          1.836 m Patient Age:    86 years         BP:           128/64 mmHg Patient Gender: M                HR:           70 bpm. Exam Location:  Inpatient Procedure: 2D Echo, Cardiac Doppler and Color Doppler Indications:    TIA 435.96 / G45.9  History:        Patient has no prior history of Echocardiogram examinations.  Sonographer:    Elmarie Shiley Dance Referring Phys: [id number] Emeline General IMPRESSIONS  1. Left ventricular ejection fraction, by estimation, is 55 to 60%. The left ventricle has normal function. The left ventricle has no regional wall motion abnormalities. There is mild left ventricular hypertrophy. Left ventricular diastolic parameters are indeterminate.  2. Right ventricular systolic function is normal. The right ventricular size is normal. There is normal pulmonary artery systolic pressure. The estimated right ventricular systolic pressure is 24.9 mmHg.  3. The mitral valve is normal in structure. Trivial mitral valve regurgitation.  4. The tricuspid valve is abnormal. Thickened leaflets. Mild regurgitation.  5. The aortic valve is tricuspid. Aortic valve regurgitation is mild to moderate. No aortic stenosis is present.  6. The inferior vena cava is normal  in size with greater than 50% respiratory variability, suggesting right atrial pressure of 3 mmHg.  7. Aortic dilatation noted. Aneurysm of the aortic root, measuring 47 mm. FINDINGS  Left Ventricle: Left ventricular ejection fraction, by estimation, is 55 to 60%. The left ventricle has normal function. The left ventricle has no regional wall motion abnormalities. The left ventricular internal cavity size was normal in size. There is  mild left ventricular hypertrophy. Left ventricular diastolic parameters are indeterminate. Right Ventricle: The right ventricular size is normal. Right vetricular wall thickness was not assessed. Right ventricular systolic function is normal. There is normal pulmonary artery systolic pressure. The tricuspid regurgitant velocity is 2.34 m/s, and with an assumed right atrial pressure of 3 mmHg, the estimated right ventricular systolic pressure is 24.9 mmHg. Left Atrium: Left atrial size was normal in size. Right Atrium: Right atrial size was normal in size. Pericardium: Trivial pericardial effusion is present. Mitral Valve: The mitral valve is normal in structure. Trivial mitral valve regurgitation. Tricuspid Valve: Thickened leaflets. The tricuspid valve is abnormal. Tricuspid valve regurgitation is mild. Aortic Valve: The aortic valve is tricuspid.  Aortic valve regurgitation is mild to moderate. Aortic regurgitation PHT measures 473 msec. No aortic stenosis is present. Pulmonic Valve: The pulmonic valve was not well visualized. Pulmonic valve regurgitation is mild. Aorta: Aortic dilatation noted. There is an aneurysm involving the aortic root. The aneurysm measures 47 mm. Venous: The inferior vena cava is normal in size with greater than 50% respiratory variability, suggesting right atrial pressure of 3 mmHg. IAS/Shunts: The interatrial septum was not well visualized.  LEFT VENTRICLE PLAX 2D LVIDd:         4.20 cm LVIDs:         3.40 cm LV PW:         0.90 cm LV IVS:        1.00 cm  LVOT diam:     2.10 cm LV SV:         57 LV SV Index:   31 LVOT Area:     3.46 cm  RIGHT VENTRICLE             IVC RV Basal diam:  3.40 cm     IVC diam: 1.70 cm RV Mid diam:    2.10 cm RV S prime:     10.40 cm/s TAPSE (M-mode): 2.0 cm LEFT ATRIUM             Index       RIGHT ATRIUM           Index LA diam:        2.40 cm 1.31 cm/m  RA Area:     18.40 cm LA Vol (A2C):   39.5 ml 21.51 ml/m RA Volume:   48.90 ml  26.63 ml/m LA Vol (A4C):   50.5 ml 27.50 ml/m LA Biplane Vol: 44.2 ml 24.07 ml/m  AORTIC VALVE LVOT Vmax:   93.60 cm/s LVOT Vmean:  57.350 cm/s LVOT VTI:    0.164 m AI PHT:      473 msec  AORTA Ao Root diam: 4.70 cm Ao Asc diam:  3.40 cm MITRAL VALVE               TRICUSPID VALVE MV Area (PHT): 2.62 cm    TR Peak grad:   21.9 mmHg MV Decel Time: 289 msec    TR Vmax:        234.00 cm/s MV E velocity: 0.51 cm/s MV A velocity: 72.30 cm/s  SHUNTS MV E/A ratio:  0.01        Systemic VTI:  0.16 m                            Systemic Diam: 2.10 cm Epifanio Lesches MD Electronically signed by Epifanio Lesches MD Signature Date/Time: 03/04/2020/9:18:28 PM    Final    CT HEAD CODE STROKE WO CONTRAST  Result Date: 03/04/2020 CLINICAL DATA:  Code stroke.  Dysarthria, facial droop EXAM: CT HEAD WITHOUT CONTRAST TECHNIQUE: Contiguous axial images were obtained from the base of the skull through the vertex without intravenous contrast. COMPARISON:  None. FINDINGS: Brain: There is no acute intracranial hemorrhage, mass effect, or edema. Gray-white differentiation is preserved. Prominence of the ventricles and sulci reflects generalized parenchymal volume loss. There is no extra-axial fluid collection. Patchy hypoattenuation in the supratentorial white matter is nonspecific but may reflect mild chronic microvascular ischemic changes. Vascular: No hyperdense vessel. There is intracranial atherosclerotic calcification at the skull base. Skull: Unremarkable Sinuses/Orbits: . minor mucosal thickening. Orbits are  unremarkable. Other: Mastoid  air cells are clear. ASPECTS ([place] Stroke Program Early CT Score) - Ganglionic level infarction (caudate, lentiform nuclei, internal capsule, insula, M1-M3 cortex): 7 - Supraganglionic infarction (M4-M6 cortex): 3 Total score (0-10 with 10 being normal): 10 IMPRESSION: No acute intracranial hemorrhage or evidence of acute infarction. ASPECT score is 10. Chronic microvascular ischemic changes. These results were communicated to Dr. Amada Jupiter at 11:21 amon 5/10/2021by text page via the [facility] messaging system. Electronically Signed   By: Guadlupe Spanish M.D.   On: 03/04/2020 11:23   IR ANGIO INTRA EXTRACRAN SEL INTERNAL CAROTID BILAT MOD SED  Result Date: 03/07/2020 INDICATION: 86 year old male with recurrent episodes of dizziness and gait ataxia with 2 episodes of fall. EXAM: BILATERAL COMMON CAROTID, BILATERAL SUBCLAVIAN, RIGHT INTERNAL CAROTID AND LEFT VERTEBRAL ARTERY ANGIOGRAMS MEDICATIONS: No antibiotics administered. ANESTHESIA/SEDATION: Versed 0.5 mg IV; Fentanyl 25 mcg IV Moderate Sedation Time:  56 The patient was continuously monitored during the procedure by the interventional radiology nurse under my direct supervision. FLUOROSCOPY TIME:  Fluoroscopy Time: 16 minutes 48 seconds (404 mGy). COMPLICATIONS: None immediate. TECHNIQUE: Informed written consent was obtained from the patient after a thorough discussion of the procedural risks, benefits and alternatives. All questions were addressed. Maximal Sterile Barrier Technique was utilized including caps, mask, sterile gowns, sterile gloves, sterile drape, hand hygiene and skin antiseptic. A timeout was performed prior to the initiation of the procedure. PROCEDURE: The right groin was prepped and draped in the usual sterile fashion. Real-time ultrasound guidance was utilized for vascular access including the acquisition of a permanent ultrasound image documenting patency of the accessed vessel. Using the modified  Seldinger technique, transfemoral access into the right common femoral artery was obtained without difficulty. Over a 0.035 inch guidewire, a 5 French sheath was inserted. Through this, and also over 0.035 inch guidewire, a 5 [place] Berenstein 2 catheter was advanced to the aortic arch and selectively positioned in the right subclavian artery. Frontal and lateral angiograms of the neck were obtained. The catheter was then retracted and then advanced over the wire into the right common carotid artery. Frontal and lateral angiograms of the neck were obtained. Using biplane roadmap, the catheter was advanced into the right internal carotid artery. Frontal, lateral and bilateral magnified oblique angiograms of the head were obtained. The catheter was pulled into the aortic arch and then advanced into the left subclavian artery. Frontal and lateral angiograms of the neck were obtained. The catheter was then advanced into the left vertebral artery. Frontal, lateral and bilateral magnified oblique views of the head were obtained. The catheter was then placed into the left common carotid artery. Frontal, lateral and bilateral oblique views of the neck were obtained. Following, frontal, lateral and bilateral magnified oblique views of the head were obtained. The catheter was subsequently withdrawn. Frontal and lateral angiograms of the right common femoral artery were obtained via sheath side port. A 5 [place] ExoSeal was utilized for access site closure. FINDINGS: Right subclavian artery angiograms: Mild luminal irregularity of the cervical right vertebral artery. There is no evidence of significant stenosis or dissection. Right common carotid artery angiograms: Mild atherosclerotic changes with calcified plaques in the right carotid bulb and minimal luminal irregularity of the cervical right ICA. There is no stenosis. Right internal carotid artery angiograms: Luminal irregularity seen in the cavernous segment of the right  ICA, consistent with mild atherosclerotic disease without significant stenosis. Multifocal area of luminal irregularity consistent with intracranial atherosclerotic disease throughout the right ACA and MCA vascular trees with multiple  areas of severe stenosis in the M2 and M3 segments, particularly in the superior division. No hemodynamically significant stenosis of the A1 or M1 segments. No occlusion, aneurysm, AVM or dural AVF. Major main and dural venous sinuses are patent. Left subclavian artery angiograms: There is increased tortuosity of the V2 segment of the left vertebral artery with 2 focal areas of less than 50% stenosis in the V2 segment. Left vertebral artery angiograms: Normal course and caliber of the intracranial left vertebral artery. The non dominant intracranial right vertebral artery seen by contrast reflux in appear normal. The basilar artery has normal caliber. Multifocal areas of luminal irregularity and stenosis are seen throughout the bilateral PCA vascular trees with moderate stenosis at the proximal P1 and P2 segments on the right and severe stenosis at the P2 segment on the left. Focal severe stenosis is also seen in the left superior cerebellar artery. No aneurysm, AVM or dural AV fistula. Major veins and dural sinuses are patent. Left common carotid artery angiograms-cervical views: Prominent atherosclerotic plaques are seen in the left carotid bifurcation resulting in approximately 40% stenosis. Left common carotid artery angiogram-cranial views: The intracranial left ICA, left M1/MCA and A1/ACA have normal course and caliber. Multifocal areas of luminal irregularity is and focal stenoses are seen throughout the distal left ACA and MCA vascular trees with focal severe stenosis in the A3 segment and multiple focal severe stenosis in the M3 and M4 segments, particularly in the superior division. No aneurysm, AVM or dural AV fistula. The major draining vein and venous sinuses are patent.  Left common femoral artery angiogram: The puncture is at the level of the common femoral artery. Arterial caliber is adequate for closure device utilization. IMPRESSION: 1. 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 2. Increased tortuosity of the cervical left vertebral artery without hemodynamically significant stenosis. 3. Prominent intracranial atherosclerotic disease with multifocal severe stenosis in the distal bilateral ACA, MCA and PCA vascular trees. No high-grade stenosis of the proximal intracranial arteries. Electronically Signed   By: Baldemar Lenis M.D.   On: 03/07/2020 15:02   IR ANGIO VERTEBRAL SEL SUBCLAVIAN INNOMINATE UNI R MOD SED  Result Date: 03/07/2020 INDICATION: 86 year old male with recurrent episodes of dizziness and gait ataxia with 2 episodes of fall. EXAM: BILATERAL COMMON CAROTID, BILATERAL SUBCLAVIAN, RIGHT INTERNAL CAROTID AND LEFT VERTEBRAL ARTERY ANGIOGRAMS MEDICATIONS: No antibiotics administered. ANESTHESIA/SEDATION: Versed 0.5 mg IV; Fentanyl 25 mcg IV Moderate Sedation Time:  59 The patient was continuously monitored during the procedure by the interventional radiology nurse under my direct supervision. FLUOROSCOPY TIME:  Fluoroscopy Time: 16 minutes 48 seconds (404 mGy). COMPLICATIONS: None immediate. TECHNIQUE: Informed written consent was obtained from the patient after a thorough discussion of the procedural risks, benefits and alternatives. All questions were addressed. Maximal Sterile Barrier Technique was utilized including caps, mask, sterile gowns, sterile gloves, sterile drape, hand hygiene and skin antiseptic. A timeout was performed prior to the initiation of the procedure. PROCEDURE: The right groin was prepped and draped in the usual sterile fashion. Real-time ultrasound guidance was utilized for vascular access including the acquisition of a permanent ultrasound image  documenting patency of the accessed vessel. Using the modified Seldinger technique, transfemoral access into the right common femoral artery was obtained without difficulty. Over a 0.035 inch guidewire, a 5 French sheath was inserted. Through this, and also over 0.035 inch guidewire, a 5 [place] Berenstein 2 catheter was advanced to the aortic  arch and selectively positioned in the right subclavian artery. Frontal and lateral angiograms of the neck were obtained. The catheter was then retracted and then advanced over the wire into the right common carotid artery. Frontal and lateral angiograms of the neck were obtained. Using biplane roadmap, the catheter was advanced into the right internal carotid artery. Frontal, lateral and bilateral magnified oblique angiograms of the head were obtained. The catheter was pulled into the aortic arch and then advanced into the left subclavian artery. Frontal and lateral angiograms of the neck were obtained. The catheter was then advanced into the left vertebral artery. Frontal, lateral and bilateral magnified oblique views of the head were obtained. The catheter was then placed into the left common carotid artery. Frontal, lateral and bilateral oblique views of the neck were obtained. Following, frontal, lateral and bilateral magnified oblique views of the head were obtained. The catheter was subsequently withdrawn. Frontal and lateral angiograms of the right common femoral artery were obtained via sheath side port. A 5 [place] ExoSeal was utilized for access site closure. FINDINGS: Right subclavian artery angiograms: Mild luminal irregularity of the cervical right vertebral artery. There is no evidence of significant stenosis or dissection. Right common carotid artery angiograms: Mild atherosclerotic changes with calcified plaques in the right carotid bulb and minimal luminal irregularity of the cervical right ICA. There is no stenosis. Right internal carotid artery angiograms:  Luminal irregularity seen in the cavernous segment of the right ICA, consistent with mild atherosclerotic disease without significant stenosis. Multifocal area of luminal irregularity consistent with intracranial atherosclerotic disease throughout the right ACA and MCA vascular trees with multiple areas of severe stenosis in the M2 and M3 segments, particularly in the superior division. No hemodynamically significant stenosis of the A1 or M1 segments. No occlusion, aneurysm, AVM or dural AVF. Major main and dural venous sinuses are patent. Left subclavian artery angiograms: There is increased tortuosity of the V2 segment of the left vertebral artery with 2 focal areas of less than 50% stenosis in the V2 segment. Left vertebral artery angiograms: Normal course and caliber of the intracranial left vertebral artery. The non dominant intracranial right vertebral artery seen by contrast reflux in appear normal. The basilar artery has normal caliber. Multifocal areas of luminal irregularity and stenosis are seen throughout the bilateral PCA vascular trees with moderate stenosis at the proximal P1 and P2 segments on the right and severe stenosis at the P2 segment on the left. Focal severe stenosis is also seen in the left superior cerebellar artery. No aneurysm, AVM or dural AV fistula. Major veins and dural sinuses are patent. Left common carotid artery angiograms-cervical views: Prominent atherosclerotic plaques are seen in the left carotid bifurcation resulting in approximately 40% stenosis. Left common carotid artery angiogram-cranial views: The intracranial left ICA, left M1/MCA and A1/ACA have normal course and caliber. Multifocal areas of luminal irregularity is and focal stenoses are seen throughout the distal left ACA and MCA vascular trees with focal severe stenosis in the A3 segment and multiple focal severe stenosis in the M3 and M4 segments, particularly in the superior division. No aneurysm, AVM or dural AV  fistula. The major draining vein and venous sinuses are patent. Left common femoral artery angiogram: The puncture is at the level of the common femoral artery. Arterial caliber is adequate for closure device utilization. IMPRESSION: 1. 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 2. Increased tortuosity of the cervical left vertebral artery without  hemodynamically significant stenosis. 3. Prominent intracranial atherosclerotic disease with multifocal severe stenosis in the distal bilateral ACA, MCA and PCA vascular trees. No high-grade stenosis of the proximal intracranial arteries. Electronically Signed   By: Pedro Earls M.D.   On: 03/07/2020 15:02   IR ANGIO VERTEBRAL SEL VERTEBRAL UNI L MOD SED  Result Date: 03/07/2020 INDICATION: 86 year old male with recurrent episodes of dizziness and gait ataxia with 2 episodes of fall. EXAM: BILATERAL COMMON CAROTID, BILATERAL SUBCLAVIAN, RIGHT INTERNAL CAROTID AND LEFT VERTEBRAL ARTERY ANGIOGRAMS MEDICATIONS: No antibiotics administered. ANESTHESIA/SEDATION: Versed 0.5 mg IV; Fentanyl 25 mcg IV Moderate Sedation Time:  9 The patient was continuously monitored during the procedure by the interventional radiology nurse under my direct supervision. FLUOROSCOPY TIME:  Fluoroscopy Time: 16 minutes 48 seconds (404 mGy). COMPLICATIONS: None immediate. TECHNIQUE: Informed written consent was obtained from the patient after a thorough discussion of the procedural risks, benefits and alternatives. All questions were addressed. Maximal Sterile Barrier Technique was utilized including caps, mask, sterile gowns, sterile gloves, sterile drape, hand hygiene and skin antiseptic. A timeout was performed prior to the initiation of the procedure. PROCEDURE: The right groin was prepped and draped in the usual sterile fashion. Real-time ultrasound guidance was utilized for vascular access including the  acquisition of a permanent ultrasound image documenting patency of the accessed vessel. Using the modified Seldinger technique, transfemoral access into the right common femoral artery was obtained without difficulty. Over a 0.035 inch guidewire, a 5 French sheath was inserted. Through this, and also over 0.035 inch guidewire, a 5 [place] Berenstein 2 catheter was advanced to the aortic arch and selectively positioned in the right subclavian artery. Frontal and lateral angiograms of the neck were obtained. The catheter was then retracted and then advanced over the wire into the right common carotid artery. Frontal and lateral angiograms of the neck were obtained. Using biplane roadmap, the catheter was advanced into the right internal carotid artery. Frontal, lateral and bilateral magnified oblique angiograms of the head were obtained. The catheter was pulled into the aortic arch and then advanced into the left subclavian artery. Frontal and lateral angiograms of the neck were obtained. The catheter was then advanced into the left vertebral artery. Frontal, lateral and bilateral magnified oblique views of the head were obtained. The catheter was then placed into the left common carotid artery. Frontal, lateral and bilateral oblique views of the neck were obtained. Following, frontal, lateral and bilateral magnified oblique views of the head were obtained. The catheter was subsequently withdrawn. Frontal and lateral angiograms of the right common femoral artery were obtained via sheath side port. A 5 [place] ExoSeal was utilized for access site closure. FINDINGS: Right subclavian artery angiograms: Mild luminal irregularity of the cervical right vertebral artery. There is no evidence of significant stenosis or dissection. Right common carotid artery angiograms: Mild atherosclerotic changes with calcified plaques in the right carotid bulb and minimal luminal irregularity of the cervical right ICA. There is no stenosis.  Right internal carotid artery angiograms: Luminal irregularity seen in the cavernous segment of the right ICA, consistent with mild atherosclerotic disease without significant stenosis. Multifocal area of luminal irregularity consistent with intracranial atherosclerotic disease throughout the right ACA and MCA vascular trees with multiple areas of severe stenosis in the M2 and M3 segments, particularly in the superior division. No hemodynamically significant stenosis of the A1 or M1 segments. No occlusion, aneurysm, AVM or dural AVF. Major main and dural venous sinuses are patent. Left subclavian artery  angiograms: There is increased tortuosity of the V2 segment of the left vertebral artery with 2 focal areas of less than 50% stenosis in the V2 segment. Left vertebral artery angiograms: Normal course and caliber of the intracranial left vertebral artery. The non dominant intracranial right vertebral artery seen by contrast reflux in appear normal. The basilar artery has normal caliber. Multifocal areas of luminal irregularity and stenosis are seen throughout the bilateral PCA vascular trees with moderate stenosis at the proximal P1 and P2 segments on the right and severe stenosis at the P2 segment on the left. Focal severe stenosis is also seen in the left superior cerebellar artery. No aneurysm, AVM or dural AV fistula. Major veins and dural sinuses are patent. Left common carotid artery angiograms-cervical views: Prominent atherosclerotic plaques are seen in the left carotid bifurcation resulting in approximately 40% stenosis. Left common carotid artery angiogram-cranial views: The intracranial left ICA, left M1/MCA and A1/ACA have normal course and caliber. Multifocal areas of luminal irregularity is and focal stenoses are seen throughout the distal left ACA and MCA vascular trees with focal severe stenosis in the A3 segment and multiple focal severe stenosis in the M3 and M4 segments, particularly in the  superior division. No aneurysm, AVM or dural AV fistula. The major draining vein and venous sinuses are patent. Left common femoral artery angiogram: The puncture is at the level of the common femoral artery. Arterial caliber is adequate for closure device utilization. IMPRESSION: 1. 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 2. Increased tortuosity of the cervical left vertebral artery without hemodynamically significant stenosis. 3. Prominent intracranial atherosclerotic disease with multifocal severe stenosis in the distal bilateral ACA, MCA and PCA vascular trees. No high-grade stenosis of the proximal intracranial arteries. Electronically Signed   By: Baldemar Lenis M.D.   On: 03/07/2020 15:02    Microbiology: Recent Results (from the past 240 hour(s))  SARS Coronavirus 2 by RT PCR (hospital order, performed in [facility] hospital lab) Nasopharyngeal Nasopharyngeal Swab     Status: None   Collection Time: 03/04/20  4:00 PM   Specimen: Nasopharyngeal Swab  Result Value Ref Range Status   SARS Coronavirus 2 NEGATIVE NEGATIVE Final    Comment: (NOTE) SARS-CoV-2 target nucleic acids are NOT DETECTED. The SARS-CoV-2 RNA is generally detectable in upper and lower respiratory specimens during the acute phase of infection. The lowest concentration of SARS-CoV-2 viral copies this assay can detect is 250 copies / mL. A negative result does not preclude SARS-CoV-2 infection and should not be used as the sole basis for treatment or other patient management decisions.  A negative result may occur with improper specimen collection / handling, submission of specimen other than nasopharyngeal swab, presence of viral mutation(s) within the areas targeted by this assay, and inadequate number of viral copies (<250 copies / mL). A negative result must be combined with clinical observations, patient history, and  epidemiological information. Fact Sheet for Patients:   BoilerBrush.com.cy Fact Sheet for Healthcare Providers: https://pope.com/ This test is not yet approved or cleared  by the [place] FDA and has been authorized for detection and/or diagnosis of SARS-CoV-2 by FDA under an Emergency Use Authorization (EUA).  This EUA will remain in effect (meaning this test can be used) for the duration of the COVID-19 declaration under Section 564(b)(1) of the Act, 21 U.S.C. section 360bbb-3(b)(1), unless the authorization is terminated or revoked sooner. Performed at [facility],  [address] [place], [place] [address]   SARS CORONAVIRUS 2 (TAT 6-24 HRS) Nasopharyngeal Nasopharyngeal Swab     Status: None   Collection Time: 03/09/20 12:34 PM   Specimen: Nasopharyngeal Swab  Result Value Ref Range Status   SARS Coronavirus 2 NEGATIVE NEGATIVE Final    Comment: (NOTE) SARS-CoV-2 target nucleic acids are NOT DETECTED. The SARS-CoV-2 RNA is generally detectable in upper and lower respiratory specimens during the acute phase of infection. Negative results do not preclude SARS-CoV-2 infection, do not rule out co-infections with other pathogens, and should not be used as the sole basis for treatment or other patient management decisions. Negative results must be combined with clinical observations, patient history, and epidemiological information. The expected result is Negative. Fact Sheet for Patients: HairSlick.no Fact Sheet for Healthcare Providers: quierodirigir.com This test is not yet approved or cleared by the [place] FDA and  has been authorized for detection and/or diagnosis of SARS-CoV-2 by FDA under an Emergency Use Authorization (EUA). This EUA will remain  in effect (meaning this test can be used) for the duration of the COVID-19 declaration under Section 56  4(b)(1) of the Act, 21 U.S.C. section 360bbb-3(b)(1), unless the authorization is terminated or revoked sooner. Performed at [facility], [address], [place], [place] [address]      Labs: Basic Metabolic Panel: Recent Labs  Lab 03/05/20 0253 03/06/20 0729 03/07/20 0441 03/08/20 0331 03/09/20 0323  NA 132* 133* 132* 133* 131*  K 3.7 3.8 4.2 3.8 3.6  CL 100 101 98 104 101  CO2 GLUCOSE 100* 94 103* 97 182*  BUN CREATININE 1.35* 1.37* 1.34* 1.34* 1.14  CALCIUM 8.6* 8.6* 8.4* 8.1* 7.8*   Liver Function Tests: Recent Labs  Lab 03/04/20 1110  AST 17  ALT 13  ALKPHOS 36*  BILITOT 1.3*  PROT 5.9*  ALBUMIN 3.4*   No results for input(s): LIPASE, AMYLASE in the last 168 hours. No results for input(s): AMMONIA in the last 168 hours. CBC: Recent Labs  Lab 03/04/20 1110 03/04/20 1112 03/07/20 0441 03/08/20 0331  WBC 7.1  --  5.8 7.6  NEUTROABS 4.7  --   --   --   HGB 12.9* 12.9* 11.2* 11.8*  HCT 39.0 38.0* 33.7* 36.0*  MCV 94.2  --  92.3 93.3  PLT 208  --  188 179   Cardiac Enzymes: No results for input(s): CKTOTAL, CKMB, CKMBINDEX, TROPONINI in the last 168 hours. BNP: BNP (last 3 results) No results for input(s): BNP in the last 8760 hours.  ProBNP (last 3 results) No results for input(s): PROBNP in the last 8760 hours.  CBG: Recent Labs  Lab 03/09/20 0641 03/09/20 1148 03/09/20 1528 03/09/20 2151 03/10/20 0649  GLUCAP 143* 169* 185* 119* 109*       Signed:  Darlin Drop, MD [facility] 03/10/2020, 9:34 AM

## 2020-03-10 NOTE — Progress Notes (Signed)
Report given to [facility] at [facility].

## 2020-03-10 NOTE — TOC Progression Note (Addendum)
Transition of Care [facility]) - Progression Note    Patient Details  Name: Joe Peters MRN: [id number] Date of Birth: 1928/02/25  [facility] of [facility]) CM/SW Contact  Nonda Lou, [place] Phone Number: 03/10/2020, 10:10 AM  Clinical Narrative:    Patient will DC to: [facility] Anticipated DC date: 03/10/20 Family notified: Zella Ball, daughter Transport by: Sharin Mons   Per MD patient ready for DC to [facility]. RN, patient, patient's family, and facility notified of DC. Discharge Summary and FL2 sent to facility. RN to call report prior to discharge [phone] [facility] Room 705). DC packet on chart. Ambulance transport requested for patient.   CSW will sign off for now as social work intervention is no longer needed. Please consult [place] again if new needs arise.    Expected Discharge Plan: Skilled Nursing Facility Barriers to Discharge: No Barriers Identified  Expected Discharge Plan and Services Expected Discharge Plan: Skilled Nursing Facility In-house Referral: Clinical Social Work Discharge Planning Services: CM Consult Post Acute Care Choice: Skilled Nursing Facility Living arrangements for the past 2 months: Apartment Expected Discharge Date: 03/10/20                                     Social Determinants of Health (SDOH) Interventions    Readmission Risk Interventions No flowsheet data found.

## 2020-04-15 ENCOUNTER — Other Ambulatory Visit: Payer: Self-pay | Admitting: *Deleted

## 2020-04-15 DIAGNOSIS — Z9889 Other specified postprocedural states: Secondary | ICD-10-CM

## 2020-04-23 ENCOUNTER — Encounter: Payer: Medicare Other | Admitting: Vascular Surgery

## 2020-04-23 ENCOUNTER — Other Ambulatory Visit (HOSPITAL_COMMUNITY): Payer: Medicare Other

## 2020-04-23 ENCOUNTER — Ambulatory Visit: Payer: Medicare Other | Admitting: Neurology

## 2020-04-25 ENCOUNTER — Encounter: Payer: Self-pay | Admitting: Vascular Surgery

## 2020-04-25 ENCOUNTER — Other Ambulatory Visit: Payer: Self-pay

## 2020-04-25 ENCOUNTER — Ambulatory Visit (HOSPITAL_COMMUNITY)
Admission: RE | Admit: 2020-04-25 | Discharge: 2020-04-25 | Disposition: A | Discharge status: Home / Self Care | Payer: Medicare Other | Source: Ambulatory Visit | Attending: Vascular Surgery | Admitting: Vascular Surgery

## 2020-04-25 ENCOUNTER — Ambulatory Visit (INDEPENDENT_AMBULATORY_CARE_PROVIDER_SITE_OTHER): Payer: Medicare Other | Admitting: Vascular Surgery

## 2020-04-25 VITALS — BP 149/75 | HR 78 | Temp 98.2°F | Resp 20 | Ht 71.0 in | Wt 147.0 lb

## 2020-04-25 DIAGNOSIS — Z9889 Other specified postprocedural states: Secondary | ICD-10-CM | POA: Insufficient documentation

## 2020-04-25 DIAGNOSIS — I712 Thoracic aortic aneurysm, without rupture, unspecified: Secondary | ICD-10-CM

## 2020-04-25 NOTE — Progress Notes (Signed)
Referring Physician: Dr. Eric Form  Patient name: Joe Peters MRN: [id number] DOB: 1928/10/21 Sex: male  REASON FOR CONSULT: Follow-up aneurysm stent graft repair  HPI: Joe Peters is a 86 y.o. male, status post thoracic stent graft repair with a [employer] device in [place] May 2020.  Patient's aneurysm repair was an incidental finding of a thoracic aneurysm during an evaluation for chest pain.  He currently has no chest or abdominal pain.  I do not have the records today but according to his daughter he recently had a CT scan of the chest in March of this year in [place].  She states that at that time they thought the stent graft was in good position.  Other medical problems include dementia, diabetes, BPH requiring urinary self-catheterization, hypertension all of which have been stable.  He has follow-up scheduled with urology in the near future.  Past Medical History:  Diagnosis Date  . AAA (abdominal aortic aneurysm) (HCC)   . BPH (benign prostatic hyperplasia)   . Dementia (HCC)   . Diabetes mellitus without complication (HCC)   . Elevated PSA   . Hypertension   . POTS (postural orthostatic tachycardia syndrome)   . Thyroid disease    Past Surgical History:  Procedure Laterality Date  . ABDOMINAL AORTIC ANEURYSM REPAIR    . APPENDECTOMY    . IR ANGIO INTRA EXTRACRAN SEL INTERNAL CAROTID BILAT MOD SED  03/07/2020  . IR ANGIO VERTEBRAL SEL SUBCLAVIAN INNOMINATE UNI R MOD SED  03/07/2020  . IR ANGIO VERTEBRAL SEL VERTEBRAL UNI L MOD SED  03/07/2020  . IR US GUIDE VASC ACCESS RIGHT  03/07/2020  . LUMBAR LAMINECTOMY    . peptic ulcer repair      Family History  Problem Relation Age of Onset  . Hypertension Mother   . Hypertension Father     SOCIAL HISTORY: Social History   Socioeconomic History  . Marital status: Unknown    Spouse name: Not on file  . Number of children: Not on file  . Years of education: Not on file  . Highest education level: Not on file    Occupational History  . Not on file  Tobacco Use  . Smoking status: Former Smoker    Packs/day: 1.00    Years: 25.00    Pack years: 25.00    Types: Cigarettes    Quit date: 12/25/2019    Years since quitting: 0.3  . Smokeless tobacco: Never Used  Vaping Use  . Vaping Use: Never used  Substance and Sexual Activity  . Alcohol use: Not Currently  . Drug use: Never  . Sexual activity: Not Currently    Birth control/protection: None  Other Topics Concern  . Not on file  Social History Narrative  . Not on file   Social Determinants of Health   Financial Resource Strain:   . Difficulty of Paying Living Expenses:   Food Insecurity:   . Worried About Programme researcher, broadcasting/film/video in the Last Year:   . Barista in the Last Year:   Transportation Needs:   . Freight forwarder (Medical):   Marland Kitchen Lack of Transportation (Non-Medical):   Physical Activity:   . Days of Exercise per Week:   . Minutes of Exercise per Session:   Stress:   . Feeling of Stress :   Social Connections:   . Frequency of Communication with Friends and Family:   . Frequency of Social Gatherings with Friends and Family:   .  Attends Religious Services:   . Active Member of Clubs or Organizations:   . Attends Banker Meetings:   Marland Kitchen Marital Status:   Intimate Partner Violence:   . Fear of Current or Ex-Partner:   . Emotionally Abused:   Marland Kitchen Physically Abused:   . Sexually Abused:     Allergies  Allergen Reactions  . Penicillins Other (See Comments)    Reaction unknown -- occurred in childhood  . Sulfa Antibiotics Other (See Comments)    Reaction unknown -- occurred in childhood    Current Outpatient Medications  Medication Sig Dispense Refill  . cilostazol (PLETAL) 50 MG tablet Take 50 mg by mouth 2 (two) times daily.    Marland Kitchen donepezil (ARICEPT) 10 MG tablet Take 10 mg by mouth at bedtime.    . fenofibrate 54 MG tablet Take 54 mg by mouth daily.    . fludrocortisone (FLORINEF) 0.1 MG tablet      . gabapentin (NEURONTIN) 100 MG capsule Take 100 mg by mouth 2 (two) times daily.    Marland Kitchen levothyroxine (SYNTHROID) 50 MCG tablet Take 50 mcg by mouth daily before breakfast.    . metFORMIN (GLUCOPHAGE) 500 MG tablet Take 500 mg by mouth in the morning and at bedtime.    . Multiple Vitamin (MULTIVITAMIN WITH MINERALS) TABS tablet Take 1 tablet by mouth daily. 90 tablet 0  . sertraline (ZOLOFT) 100 MG tablet Take 100 mg by mouth daily.    . simvastatin (ZOCOR) 40 MG tablet Take 40 mg by mouth daily.    . tamsulosin (FLOMAX) 0.4 MG CAPS capsule Take 0.4 mg by mouth daily.     No current facility-administered medications for this visit.    ROS:   General:  No weight loss, Fever, chills  HEENT: No recent headaches, no nasal bleeding, no visual changes, no sore throat  Neurologic: No dizziness, blackouts, seizures. No recent symptoms of stroke or mini- stroke. No recent episodes of slurred speech, or temporary blindness.  Cardiac: No recent episodes of chest pain/pressure, no shortness of breath at rest.  No shortness of breath with exertion.  Denies history of atrial fibrillation or irregular heartbeat  Vascular: No history of rest pain in feet.  No history of claudication.  No history of non-healing ulcer, No history of DVT   Pulmonary: No home oxygen, no productive cough, no hemoptysis,  No asthma or wheezing  Musculoskeletal:  [ ]  Arthritis, [ ]  Low back pain,  [ ]  Joint pain  Hematologic:No history of hypercoagulable state.  No history of easy bleeding.  No history of anemia  Gastrointestinal: No hematochezia or melena,  No gastroesophageal reflux, no trouble swallowing  Urinary: [ ]  chronic Kidney disease, [ ]  on HD - [ ]  MWF or [ ]  TTHS, [ ]  Burning with urination, [ ]  Frequent urination, [ ]  Difficulty urinating;   Skin: No rashes  Psychological: No history of anxiety,  No history of depression   Physical Examination  Vitals:   04/25/20 0839  BP: (!) 149/75  Pulse: 78   Resp: 20  Temp: 98.2 F (36.8 C)  SpO2: 100%  Weight: 147 lb (66.7 kg)  Height: 5\' 11"  (1.803 m)    Body mass index is 20.5 kg/m.  General:  Alert and oriented, no acute distress HEENT: Normal Neck: No JVD Cardiac: Regular Rate and Rhythm  Abdomen: Soft, non-tender, non-distended, no mass Skin: No rash Extremity Pulses:  2+ radial, brachial, femoral, dorsalis pedis pulses bilaterally Musculoskeletal: No deformity 1+ pedal  and ankle edema bilaterally Neurologic: Upper and lower extremity motor 5/5 and symmetric  DATA:  Aortic ultrasound shows normal aortic diameter ectatic right common iliac artery at 2 cm.  I also reviewed a plain chest x-ray done at [facility] in May of this year which shows the stent graft with no evidence of fracture or collapse although a diameter is noted this is not an accurate measurement for thoracic aneurysms.  ASSESSMENT: 86 year old male status post thoracic aneurysm stent graft repair May 2020.  Currently asymptomatic.   PLAN: CT angio of the chest and office visit with me in March 2022.   Fabienne Bruns, MD [facility] Office: [phone] Pager: [phone]

## 2020-05-07 ENCOUNTER — Inpatient Hospital Stay (HOSPITAL_COMMUNITY): Payer: Medicare Other

## 2020-05-07 ENCOUNTER — Other Ambulatory Visit: Payer: Self-pay

## 2020-05-07 ENCOUNTER — Emergency Department (HOSPITAL_COMMUNITY): Payer: Medicare Other

## 2020-05-07 ENCOUNTER — Inpatient Hospital Stay (HOSPITAL_COMMUNITY)
Admission: EM | Admit: 2020-05-07 | Discharge: 2020-05-09 | DRG: 309 | Disposition: A | Discharge status: Hospice | Payer: Medicare Other | Source: Ambulatory Visit | Attending: Internal Medicine | Admitting: Internal Medicine

## 2020-05-07 DIAGNOSIS — Z6820 Body mass index (BMI) 20.0-20.9, adult: Secondary | ICD-10-CM

## 2020-05-07 DIAGNOSIS — IMO0002 Reserved for concepts with insufficient information to code with codable children: Secondary | ICD-10-CM | POA: Diagnosis present

## 2020-05-07 DIAGNOSIS — I4891 Unspecified atrial fibrillation: Secondary | ICD-10-CM | POA: Diagnosis present

## 2020-05-07 DIAGNOSIS — E1365 Other specified diabetes mellitus with hyperglycemia: Secondary | ICD-10-CM | POA: Diagnosis not present

## 2020-05-07 DIAGNOSIS — W19XXXA Unspecified fall, initial encounter: Secondary | ICD-10-CM | POA: Diagnosis present

## 2020-05-07 DIAGNOSIS — I951 Orthostatic hypotension: Secondary | ICD-10-CM | POA: Diagnosis not present

## 2020-05-07 DIAGNOSIS — R627 Adult failure to thrive: Secondary | ICD-10-CM | POA: Diagnosis not present

## 2020-05-07 DIAGNOSIS — E46 Unspecified protein-calorie malnutrition: Secondary | ICD-10-CM | POA: Diagnosis present

## 2020-05-07 DIAGNOSIS — Z20822 Contact with and (suspected) exposure to covid-19: Secondary | ICD-10-CM | POA: Diagnosis present

## 2020-05-07 DIAGNOSIS — E860 Dehydration: Secondary | ICD-10-CM | POA: Diagnosis present

## 2020-05-07 DIAGNOSIS — Z66 Do not resuscitate: Secondary | ICD-10-CM | POA: Diagnosis present

## 2020-05-07 DIAGNOSIS — G903 Multi-system degeneration of the autonomic nervous system: Secondary | ICD-10-CM | POA: Diagnosis present

## 2020-05-07 DIAGNOSIS — K5730 Diverticulosis of large intestine without perforation or abscess without bleeding: Secondary | ICD-10-CM | POA: Diagnosis present

## 2020-05-07 DIAGNOSIS — Z880 Allergy status to penicillin: Secondary | ICD-10-CM

## 2020-05-07 DIAGNOSIS — Z79890 Hormone replacement therapy (postmenopausal): Secondary | ICD-10-CM

## 2020-05-07 DIAGNOSIS — L89152 Pressure ulcer of sacral region, stage 2: Secondary | ICD-10-CM | POA: Diagnosis present

## 2020-05-07 DIAGNOSIS — E871 Hypo-osmolality and hyponatremia: Secondary | ICD-10-CM | POA: Diagnosis present

## 2020-05-07 DIAGNOSIS — F0390 Unspecified dementia without behavioral disturbance: Secondary | ICD-10-CM | POA: Diagnosis present

## 2020-05-07 DIAGNOSIS — Z8249 Family history of ischemic heart disease and other diseases of the circulatory system: Secondary | ICD-10-CM

## 2020-05-07 DIAGNOSIS — I480 Paroxysmal atrial fibrillation: Secondary | ICD-10-CM | POA: Diagnosis present

## 2020-05-07 DIAGNOSIS — R338 Other retention of urine: Secondary | ICD-10-CM | POA: Diagnosis present

## 2020-05-07 DIAGNOSIS — I452 Bifascicular block: Secondary | ICD-10-CM | POA: Diagnosis present

## 2020-05-07 DIAGNOSIS — E1165 Type 2 diabetes mellitus with hyperglycemia: Secondary | ICD-10-CM | POA: Diagnosis present

## 2020-05-07 DIAGNOSIS — E872 Acidosis: Secondary | ICD-10-CM | POA: Diagnosis present

## 2020-05-07 DIAGNOSIS — Z7189 Other specified counseling: Secondary | ICD-10-CM | POA: Diagnosis not present

## 2020-05-07 DIAGNOSIS — I10 Essential (primary) hypertension: Secondary | ICD-10-CM | POA: Diagnosis not present

## 2020-05-07 DIAGNOSIS — B9620 Unspecified Escherichia coli [E. coli] as the cause of diseases classified elsewhere: Secondary | ICD-10-CM | POA: Diagnosis present

## 2020-05-07 DIAGNOSIS — E785 Hyperlipidemia, unspecified: Secondary | ICD-10-CM | POA: Diagnosis present

## 2020-05-07 DIAGNOSIS — G90A Postural orthostatic tachycardia syndrome (POTS): Secondary | ICD-10-CM | POA: Diagnosis present

## 2020-05-07 DIAGNOSIS — I498 Other specified cardiac arrhythmias: Secondary | ICD-10-CM | POA: Diagnosis present

## 2020-05-07 DIAGNOSIS — E861 Hypovolemia: Secondary | ICD-10-CM | POA: Diagnosis present

## 2020-05-07 DIAGNOSIS — I129 Hypertensive chronic kidney disease with stage 1 through stage 4 chronic kidney disease, or unspecified chronic kidney disease: Secondary | ICD-10-CM | POA: Diagnosis present

## 2020-05-07 DIAGNOSIS — N138 Other obstructive and reflux uropathy: Secondary | ICD-10-CM | POA: Diagnosis present

## 2020-05-07 DIAGNOSIS — I672 Cerebral atherosclerosis: Secondary | ICD-10-CM | POA: Diagnosis present

## 2020-05-07 DIAGNOSIS — N1831 Chronic kidney disease, stage 3a: Secondary | ICD-10-CM | POA: Diagnosis present

## 2020-05-07 DIAGNOSIS — N401 Enlarged prostate with lower urinary tract symptoms: Secondary | ICD-10-CM | POA: Diagnosis present

## 2020-05-07 DIAGNOSIS — Z515 Encounter for palliative care: Secondary | ICD-10-CM | POA: Diagnosis not present

## 2020-05-07 DIAGNOSIS — Z882 Allergy status to sulfonamides status: Secondary | ICD-10-CM

## 2020-05-07 DIAGNOSIS — E876 Hypokalemia: Secondary | ICD-10-CM | POA: Diagnosis present

## 2020-05-07 DIAGNOSIS — E039 Hypothyroidism, unspecified: Secondary | ICD-10-CM | POA: Diagnosis present

## 2020-05-07 DIAGNOSIS — N390 Urinary tract infection, site not specified: Secondary | ICD-10-CM | POA: Diagnosis present

## 2020-05-07 DIAGNOSIS — E1349 Other specified diabetes mellitus with other diabetic neurological complication: Secondary | ICD-10-CM | POA: Diagnosis not present

## 2020-05-07 DIAGNOSIS — Z8673 Personal history of transient ischemic attack (TIA), and cerebral infarction without residual deficits: Secondary | ICD-10-CM

## 2020-05-07 DIAGNOSIS — Z8679 Personal history of other diseases of the circulatory system: Secondary | ICD-10-CM

## 2020-05-07 DIAGNOSIS — E1122 Type 2 diabetes mellitus with diabetic chronic kidney disease: Secondary | ICD-10-CM | POA: Diagnosis present

## 2020-05-07 DIAGNOSIS — Z87891 Personal history of nicotine dependence: Secondary | ICD-10-CM

## 2020-05-07 DIAGNOSIS — K8020 Calculus of gallbladder without cholecystitis without obstruction: Secondary | ICD-10-CM | POA: Diagnosis present

## 2020-05-07 DIAGNOSIS — L8990 Pressure ulcer of unspecified site, unspecified stage: Secondary | ICD-10-CM | POA: Insufficient documentation

## 2020-05-07 DIAGNOSIS — Z79899 Other long term (current) drug therapy: Secondary | ICD-10-CM

## 2020-05-07 DIAGNOSIS — R296 Repeated falls: Secondary | ICD-10-CM | POA: Diagnosis present

## 2020-05-07 DIAGNOSIS — Z8711 Personal history of peptic ulcer disease: Secondary | ICD-10-CM

## 2020-05-07 LAB — CBC
HCT: 33.2 % — ABNORMAL LOW (ref 39.0–52.0)
Hemoglobin: 10.9 g/dL — ABNORMAL LOW (ref 13.0–17.0)
MCH: 29.5 pg (ref 26.0–34.0)
MCHC: 32.8 g/dL (ref 30.0–36.0)
MCV: 90 fL (ref 80.0–100.0)
Platelets: 222 10*3/uL (ref 150–400)
RBC: 3.69 MIL/uL — ABNORMAL LOW (ref 4.22–5.81)
RDW: 12.9 % (ref 11.5–15.5)
WBC: 8.4 10*3/uL (ref 4.0–10.5)
nRBC: 0 % (ref 0.0–0.2)

## 2020-05-07 LAB — COMPREHENSIVE METABOLIC PANEL
ALT: 13 U/L (ref 0–44)
AST: 15 U/L (ref 15–41)
Albumin: 2.4 g/dL — ABNORMAL LOW (ref 3.5–5.0)
Alkaline Phosphatase: 46 U/L (ref 38–126)
Anion gap: 10 (ref 5–15)
BUN: 15 mg/dL (ref 8–23)
CO2: 24 mmol/L (ref 22–32)
Calcium: 8.2 mg/dL — ABNORMAL LOW (ref 8.9–10.3)
Chloride: 97 mmol/L — ABNORMAL LOW (ref 98–111)
Creatinine, Ser: 0.99 mg/dL (ref 0.61–1.24)
GFR calc Af Amer: >60 mL/min (ref >60)
GFR calc non Af Amer: >60 mL/min (ref >60)
Glucose, Bld: 181 mg/dL — ABNORMAL HIGH (ref 70–99)
Potassium: 3.1 mmol/L — ABNORMAL LOW (ref 3.5–5.1)
Sodium: 131 mmol/L — ABNORMAL LOW (ref 135–145)
Total Bilirubin: 0.7 mg/dL (ref 0.3–1.2)
Total Protein: 5 g/dL — ABNORMAL LOW (ref 6.5–8.1)

## 2020-05-07 LAB — I-STAT CHEM 8, ED
BUN: 17 mg/dL (ref 8–23)
Calcium, Ion: 0.97 mmol/L — ABNORMAL LOW (ref 1.15–1.40)
Chloride: 95 mmol/L — ABNORMAL LOW (ref 98–111)
Creatinine, Ser: 0.9 mg/dL (ref 0.61–1.24)
Glucose, Bld: 179 mg/dL — ABNORMAL HIGH (ref 70–99)
HCT: 32 % — ABNORMAL LOW (ref 39.0–52.0)
Hemoglobin: 10.9 g/dL — ABNORMAL LOW (ref 13.0–17.0)
Potassium: 2.9 mmol/L — ABNORMAL LOW (ref 3.5–5.1)
Sodium: 131 mmol/L — ABNORMAL LOW (ref 135–145)
TCO2: 24 mmol/L (ref 22–32)

## 2020-05-07 LAB — URINALYSIS, ROUTINE W REFLEX MICROSCOPIC
Bilirubin Urine: NEGATIVE
Glucose, UA: NEGATIVE mg/dL
Ketones, ur: NEGATIVE mg/dL
Nitrite: NEGATIVE
Protein, ur: 100 mg/dL — AB
Specific Gravity, Urine: 1.011 (ref 1.005–1.030)
WBC, UA: >50 WBC/hpf — ABNORMAL HIGH (ref 0–5)
pH: 6 (ref 5.0–8.0)

## 2020-05-07 LAB — TROPONIN I (HIGH SENSITIVITY): Troponin I (High Sensitivity): 14 ng/L (ref <18)

## 2020-05-07 LAB — RESPIRATORY PANEL BY RT PCR (FLU A&B, COVID)
Influenza A by PCR: NEGATIVE
Influenza B by PCR: NEGATIVE
SARS Coronavirus 2 by RT PCR: NEGATIVE

## 2020-05-07 LAB — TSH: TSH: 3.554 u[IU]/mL (ref 0.350–4.500)

## 2020-05-07 MED ORDER — HYDROCODONE-ACETAMINOPHEN 5-325 MG PO TABS: 1.0000 | ORAL_TABLET | ORAL | Status: DC | PRN

## 2020-05-07 MED ORDER — ONDANSETRON HCL 4 MG/2ML IJ SOLN: 4.0000 mg | Freq: Four times a day (QID) | INTRAMUSCULAR | Status: DC | PRN

## 2020-05-07 MED ORDER — CILOSTAZOL 50 MG PO TABS
50.0000 mg | ORAL_TABLET | Freq: Two times a day (BID) | ORAL | Status: DC
  Administered 2020-05-07 – 2020-05-09 (×4): 50 mg via ORAL
  Filled 2020-05-07 (×6): qty 1

## 2020-05-07 MED ORDER — DIPHENOXYLATE-ATROPINE 2.5-0.025 MG PO TABS
1.0000 | ORAL_TABLET | Freq: Four times a day (QID) | ORAL | Status: DC | PRN
  Administered 2020-05-08: 1 via ORAL
  Filled 2020-05-07: qty 1

## 2020-05-07 MED ORDER — FLUDROCORTISONE ACETATE 0.1 MG PO TABS
0.1000 mg | ORAL_TABLET | Freq: Every day | ORAL | Status: DC
  Administered 2020-05-08 – 2020-05-09 (×2): 0.1 mg via ORAL
  Filled 2020-05-07 (×2): qty 1

## 2020-05-07 MED ORDER — MEGESTROL ACETATE 40 MG PO TABS
40.0000 mg | ORAL_TABLET | Freq: Every day | ORAL | Status: DC
  Filled 2020-05-07 (×2): qty 1

## 2020-05-07 MED ORDER — ZOLPIDEM TARTRATE 5 MG PO TABS: 5.0000 mg | ORAL_TABLET | Freq: Every evening | ORAL | Status: DC | PRN

## 2020-05-07 MED ORDER — HYDRALAZINE HCL 20 MG/ML IJ SOLN: 5.0000 mg | INTRAMUSCULAR | Status: DC | PRN

## 2020-05-07 MED ORDER — FENOFIBRATE 54 MG PO TABS
54.0000 mg | ORAL_TABLET | Freq: Every day | ORAL | Status: DC
  Administered 2020-05-08 – 2020-05-09 (×2): 54 mg via ORAL
  Filled 2020-05-07 (×2): qty 1

## 2020-05-07 MED ORDER — IOHEXOL 300 MG/ML  SOLN
90.0000 mL | Freq: Once | INTRAMUSCULAR | Status: AC | PRN
  Administered 2020-05-07: 90 mL via INTRAVENOUS

## 2020-05-07 MED ORDER — DUTASTERIDE 0.5 MG PO CAPS
0.5000 mg | ORAL_CAPSULE | Freq: Every day | ORAL | Status: DC
  Administered 2020-05-08 – 2020-05-09 (×2): 0.5 mg via ORAL
  Filled 2020-05-07 (×3): qty 1

## 2020-05-07 MED ORDER — GABAPENTIN 100 MG PO CAPS
100.0000 mg | ORAL_CAPSULE | Freq: Two times a day (BID) | ORAL | Status: DC
  Administered 2020-05-07 – 2020-05-09 (×4): 100 mg via ORAL
  Filled 2020-05-07 (×4): qty 1

## 2020-05-07 MED ORDER — ONDANSETRON HCL 4 MG PO TABS: 4.0000 mg | ORAL_TABLET | Freq: Four times a day (QID) | ORAL | Status: DC | PRN

## 2020-05-07 MED ORDER — MORPHINE SULFATE (PF) 2 MG/ML IV SOLN: 2.0000 mg | INTRAVENOUS | Status: DC | PRN

## 2020-05-07 MED ORDER — PSYLLIUM 95 % PO PACK
1.0000 | PACK | Freq: Every day | ORAL | Status: DC
  Administered 2020-05-08: 1 via ORAL
  Filled 2020-05-07 (×3): qty 1

## 2020-05-07 MED ORDER — DONEPEZIL HCL 5 MG PO TABS
10.0000 mg | ORAL_TABLET | Freq: Every day | ORAL | Status: DC
  Administered 2020-05-07 – 2020-05-08 (×2): 10 mg via ORAL
  Filled 2020-05-07 (×2): qty 2
  Filled 2020-05-07: qty 1

## 2020-05-07 MED ORDER — SIMVASTATIN 20 MG PO TABS
40.0000 mg | ORAL_TABLET | Freq: Every day | ORAL | Status: DC
  Administered 2020-05-08 – 2020-05-09 (×2): 40 mg via ORAL
  Filled 2020-05-07 (×2): qty 2

## 2020-05-07 MED ORDER — ACETAMINOPHEN 650 MG RE SUPP: 650.0000 mg | Freq: Four times a day (QID) | RECTAL | Status: DC | PRN

## 2020-05-07 MED ORDER — SERTRALINE HCL 50 MG PO TABS
50.0000 mg | ORAL_TABLET | Freq: Every day | ORAL | Status: DC
  Administered 2020-05-08 – 2020-05-09 (×2): 50 mg via ORAL
  Filled 2020-05-07 (×2): qty 1

## 2020-05-07 MED ORDER — ACETAMINOPHEN 325 MG PO TABS: 650.0000 mg | ORAL_TABLET | Freq: Four times a day (QID) | ORAL | Status: DC | PRN

## 2020-05-07 MED ORDER — POTASSIUM CHLORIDE CRYS ER 20 MEQ PO TBCR
40.0000 meq | EXTENDED_RELEASE_TABLET | Freq: Once | ORAL | Status: AC
  Administered 2020-05-07: 40 meq via ORAL
  Filled 2020-05-07: qty 2

## 2020-05-07 MED ORDER — LEVOTHYROXINE SODIUM 50 MCG PO TABS
50.0000 ug | ORAL_TABLET | Freq: Every day | ORAL | Status: DC
  Administered 2020-05-08 – 2020-05-09 (×2): 50 ug via ORAL
  Filled 2020-05-07 (×2): qty 1

## 2020-05-07 MED ORDER — ENOXAPARIN SODIUM 40 MG/0.4ML ~~LOC~~ SOLN
40.0000 mg | SUBCUTANEOUS | Status: DC
  Administered 2020-05-07 – 2020-05-08 (×2): 40 mg via SUBCUTANEOUS
  Filled 2020-05-07 (×2): qty 0.4

## 2020-05-07 NOTE — Consult Note (Signed)
Cardiology Consultation:   Patient ID: Joe Peters MRN: [id number]; DOB: 08/22/1928  Admit date: 05/07/2020 Date of Consult: 05/07/2020  Primary Care Provider: Martha ClanShaw, William, MD [facility] Cardiologist: Reatha HarpsWesley T O'Neal, MD [facility] Electrophysiologist:  None    Patient Profile:   Joe PatrickKenneth Peters is a 86 y.o. male with a hx of thoracic aorta aneurysm s/p repair (02/2019), orthostatic hypotension/?POTS, dementia, DM, HTN, HLD, and BPH requiring self-catheterization who is being seen today for the evaluation of Afib at the request of Dr. Hyacinth MeekerMiller.  History of Present Illness:   Joe Peters does not currently follow with a cardiologist. [facility] [place] [facility] [place] be near his step-daughter who makes medical decisions. He currently lives in independent living, but is planning to make the transition to assisted living. [facility] - this was an incidental finding during evaluation for chest pain. He has established care with [facility] Dr. Darrick PennaFields and was seen on 04/25/20.  He was recently hospitalized 03/04/20-03/10/20 for TIA. [facility] following period of unsteadiness during PT. CT head negative for acute findings. No MRI due to unknown metal in body ([place] vet). He was discharged to SNF. Neurology recommended ASA, pletal, and statin. Neurology started on florinef for orthostatic hypotension. At follow up, ASA was discontinued.   Step-daughter is at bedside and helps with history. She is a Engineer, civil (consulting)nurse at [employer]. She reports at least 1 month of diarrhea coinciding with poor PO intake, questions if this is related to starting florinef. He has lost 30-40 lbs in the past 6-9 months. He discharged to a SNF following last hospitalization and had PT/OT/Speach services. He discharged back to his independent living facility, but has continued to decline in terms of functional status. She reports increased fatigue with any  activity/walking to dining hall since last week - questions if he was in Afib at that time. He presented to primary care this morning and found to have HR 130 with hypotension in the 80s systolic. She also reports that he fell twice over the weekend, but did not hit his head. He continues to become dizzy and lightheaded upon standing and this has limited his progress with PT.   He denies chest pain, shortness of breath, orthopnea, and frank LOC.    Past Medical History:  Diagnosis Date  . AAA (abdominal aortic aneurysm) (HCC)   . BPH (benign prostatic hyperplasia)   . Dementia (HCC)   . Diabetes mellitus without complication (HCC)   . Elevated PSA   . Hypertension   . POTS (postural orthostatic tachycardia syndrome)   . Thyroid disease     Past Surgical History:  Procedure Laterality Date  . ABDOMINAL AORTIC ANEURYSM REPAIR    . APPENDECTOMY    . IR ANGIO INTRA EXTRACRAN SEL INTERNAL CAROTID BILAT MOD SED  03/07/2020  . IR ANGIO VERTEBRAL SEL SUBCLAVIAN INNOMINATE UNI R MOD SED  03/07/2020  . IR ANGIO VERTEBRAL SEL VERTEBRAL UNI L MOD SED  03/07/2020  . IR US GUIDE VASC ACCESS RIGHT  03/07/2020  . LUMBAR LAMINECTOMY    . peptic ulcer repair       Home Medications:  Prior to Admission medications   Medication Sig Start Date End Date Taking? Authorizing Provider  cilostazol (PLETAL) 50 MG tablet Take 50 mg by mouth 2 (two) times daily.   Yes [provider]  donepezil (ARICEPT) 10 MG tablet Take 10 mg by mouth at bedtime.  Yes [provider]  fenofibrate 54 MG tablet Take 54 mg by mouth daily.   Yes [provider]  fludrocortisone (FLORINEF) 0.1 MG tablet Take 0.1 mg by mouth daily.  04/22/20  Yes [provider]  gabapentin (NEURONTIN) 100 MG capsule Take 100 mg by mouth 2 (two) times daily.   Yes [provider]  levothyroxine (SYNTHROID) 50 MCG tablet Take 50 mcg by mouth daily before breakfast.   Yes [provider]    metFORMIN (GLUCOPHAGE) 500 MG tablet Take 500 mg by mouth in the morning and at bedtime.   Yes [provider]  Multiple Vitamin (MULTIVITAMIN WITH MINERALS) TABS tablet Take 1 tablet by mouth daily. 03/10/20 06/08/20 Yes Hall, Carole N, DO  sertraline (ZOLOFT) 100 MG tablet Take 100 mg by mouth daily. 04/22/20  Yes [provider]  simvastatin (ZOCOR) 40 MG tablet Take 40 mg by mouth daily.   Yes [provider]  tamsulosin (FLOMAX) 0.4 MG CAPS capsule Take 0.4 mg by mouth daily.   Yes [provider]    Inpatient Medications: Scheduled Meds:  Continuous Infusions:  PRN Meds:   Allergies:    Allergies  Allergen Reactions  . Penicillins Other (See Comments)    Reaction unknown -- occurred in childhood  . Sulfa Antibiotics Other (See Comments)    Reaction unknown -- occurred in childhood    Social History:   Social History   Socioeconomic History  . Marital status: Unknown    Spouse name: Not on file  . Number of children: Not on file  . Years of education: Not on file  . Highest education level: Not on file  Occupational History  . Not on file  Tobacco Use  . Smoking status: Former Smoker    Packs/day: 1.00    Years: 25.00    Pack years: 25.00    Types: Cigarettes    Quit date: 12/25/2019    Years since quitting: 0.3  . Smokeless tobacco: Never Used  Vaping Use  . Vaping Use: Never used  Substance and Sexual Activity  . Alcohol use: Not Currently  . Drug use: Never  . Sexual activity: Not Currently    Birth control/protection: None  Other Topics Concern  . Not on file  Social History Narrative  . Not on file   Social Determinants of Health   Financial Resource Strain:   . Difficulty of Paying Living Expenses:   Food Insecurity:   . Worried About Programme researcher, broadcasting/film/video in the Last Year:   . Barista in the Last Year:   Transportation Needs:   . Freight forwarder (Medical):   Marland Kitchen Lack of Transportation  (Non-Medical):   Physical Activity:   . Days of Exercise per Week:   . Minutes of Exercise per Session:   Stress:   . Feeling of Stress :   Social Connections:   . Frequency of Communication with Friends and Family:   . Frequency of Social Gatherings with Friends and Family:   . Attends Religious Services:   . Active Member of Clubs or Organizations:   . Attends Banker Meetings:   Marland Kitchen Marital Status:   Intimate Partner Violence:   . Fear of Current or Ex-Partner:   . Emotionally Abused:   Marland Kitchen Physically Abused:   . Sexually Abused:     Family History:    Family History  Problem Relation Age of Onset  . Hypertension Mother   . Hypertension  Father      ROS:  Please see the history of present illness.   All other ROS reviewed and negative.     Physical Exam/Data:   Vitals:   05/07/20 1430 05/07/20 1530 05/07/20 1545 05/07/20 1600  BP: 137/83 (!) 148/74 (!) 147/82 127/80  Pulse: 91 97 90 93  Resp: (!) 24 (!) 21 13 15   Temp:      TempSrc:      SpO2: 100% 100% 100% 100%  Weight:      Height:       No intake or output data in the 24 hours ending 05/07/20 1626 Last 3 Weights 05/07/2020 04/25/2020 03/04/2020  Weight (lbs) 147 lb 0.8 oz 147 lb 147 lb 14.9 oz  Weight (kg) 66.7 kg 66.679 kg 67.1 kg     Body mass index is 20.51 kg/m.  General:  Elderly male in NAD resting in recumbent position  HEENT: normal Lymph: no adenopathy Neck: no JVD Endocrine:  No thryomegaly Vascular: No carotid bruits; FA pulses 2+ bilaterally without bruits  Cardiac:  Irregular rhythm, regular rate Lungs:  clear to auscultation bilaterally, no wheezing, rhonchi or rales  Abd: soft, nontender, no hepatomegaly  Ext: mild B LE edema Musculoskeletal:  No deformities, BUE and BLE strength normal and equal Skin: warm and dry  Neuro:  CNs 2-12 intact, no focal abnormalities noted Psych:  Normal affect   EKG:  The EKG was personally reviewed and demonstrates:  Appear to be atrial  fibrillation with ventricular rate 102, RBBB, LAFB Telemetry:  Telemetry was personally reviewed and demonstrates:  paroxsymal atrial fibrillation - rate controlled in the 70-90s  Relevant CV Studies:  Echo pending  Laboratory Data:  High Sensitivity Troponin:   Recent Labs  Lab 05/07/20 1237  TROPONINIHS 14     ChemistryNo results for input(s): NA, K, CL, CO2, GLUCOSE, BUN, CREATININE, CALCIUM, GFRNONAA, GFRAA, ANIONGAP in the last 168 hours.  No results for input(s): PROT, ALBUMIN, AST, ALT, ALKPHOS, BILITOT in the last 168 hours. HematologyNo results for input(s): WBC, RBC, HGB, HCT, MCV, MCH, MCHC, RDW, PLT in the last 168 hours. BNPNo results for input(s): BNP, PROBNP in the last 168 hours.  DDimer No results for input(s): DDIMER in the last 168 hours.   Radiology/Studies:  DG Chest Port 1 View  Result Date: 05/07/2020 CLINICAL DATA:  Weakness today. EXAM: PORTABLE CHEST 1 VIEW COMPARISON:  PA and lateral chest 03/04/2020. FINDINGS: The lungs are clear. Aortic stent graft remains in place in the descending thoracic aorta. A smoothly marginated ovoid density along the lateral margin of the stent is unchanged and likely represents excluded aneurysm. Atherosclerosis noted. Heart size is normal. No acute or focal bony abnormality. IMPRESSION: No acute disease. Aortic Atherosclerosis (ICD10-I70.0). Electronically Signed   By: 05/04/2020 M.D.   On: 05/07/2020 12:59   {  Assessment and Plan:   Paroxysmal atrial fibrillation with reported RVR - question if tracings from May 2021 demonstrated Afib - telemetry with irregular rhythm and no consistent p waves - patient reports an increase in fatigue over the weekend - he fell twice in the past 4 days due to orthostatic hypotension vs POTS - will obtain echocardiogram - will check electrolytes, including Mg - TSH normal This patients CHA2DS2-VASc Score and unadjusted Ischemic Stroke Rate (% per year) is equal to 11.2 % stroke  rate/year from a score of 7 (2age, 2TIA, DM, HTN, AAA) - will need to discuss risks and benefits of anticoagulation given his propensity  for falls - adding rate controlling medication will need to be weighed against tendency for orthostatic hypotension - metoprolol may be the best option - will avoid amiodarone to avoid conversion if he is not anticoagulated - recommend monitoring on telemetry overnight and walk patient tomorrow to observe rates   Neurogenic orthostatic hypotension/POTS Poor PO intake, dehydration - has been on florinef - question if this is causing diarrhea - plan to hydrate per primary - appetite stimulant   AAA s/p repair - followed by [facility]   Hx of TIA - ASA has been stopped - continue pletal for now     For questions or updates, please contact [facility] Please consult [email or web address] for contact info under    Signed, Marcelino Duster, [place]  05/07/2020 4:26 PM

## 2020-05-07 NOTE — H&P (Signed)
History and Physical    Joe Peters GLO:[id number] DOB: 10-27-1927 DOA: 05/07/2020  PCP: Martha Clan, MD Consultants:  Darrick Penna - vascular; Arita Miss - urology Patient coming from: [facility], planning to move up to ALF when bed is available; NOK: Step-daughter, [phone]  Chief Complaint: Weakness  HPI: Joe Peters is a 86 y.o. male with medical history significant of POTS; HTN; DM; AAA; and dementia presenting with weakness.  His daughter reports that he has been dizzy.  He fell Saturday and Sunday.  His daughter was out of town and the facility called multiple times.  EMS came Saturday and he did not want to go to the hospital.  He has POTS - lying and sitting is ok.  He was last admitted from 5/10-16 with POTS vs. Autonomic dysfunction.  He was discharged to [facility] and has since transitioned to [facility].  He followed up with Dr. Darrick Penna re: aneurysm stent graft repair in 02/2019.  He has been dehydrated and is not eating/drinking.  He has had diarrhea for the last 2 weeks - it sounds like they think this is a functional diarrhea, possibly related to Florinef; Glucerna seemed to make it worse.  He moved to [facility] in April, has lost 30-40 pounds.  His daughter thinks afib started Friday or Saturday, has progressively gotten worse.  He went to SNF rehab after his last hospitalization and his daughter thinks this "didn't do him any good."  He has recently become too tired to walk through the independent living facility.  He was barely able to stand since Friday due to weakness.  No chest pain.  She reports that she and his son who lives in [place] have seen his spiraling and know that he is failing.  They would not desire heroic measures and would like to stop diarrhea and start appetite enhancement in order to improve whatever time he has left.    ED Course:  Multiple episode of syncope - ?POTS.  He was started on Florinef with symptomatic improvement.  Now daily  diarrhea, progressive weakness, now with new afib (130s today).  No known h/o afib but prior "sinus with ectopy", possibly afib.  Clinically looks ok with mildly elevated HR.  Cardiology consulted.  Stool studies and C diff pending.  Needs mild IVF and electrolyte correction.  Patient self-caths BID.  Review of Systems: Unable to perform   Ambulatory Status:  Ambulates with a walker  COVID Vaccine Status:  Complete  Past Medical History:  Diagnosis Date  . AAA (abdominal aortic aneurysm) (HCC)   . BPH (benign prostatic hyperplasia)   . Dementia (HCC)   . Diabetes mellitus without complication (HCC)   . Elevated PSA   . Hypertension   . POTS (postural orthostatic tachycardia syndrome)   . Thyroid disease     Past Surgical History:  Procedure Laterality Date  . ABDOMINAL AORTIC ANEURYSM REPAIR    . APPENDECTOMY    . IR ANGIO INTRA EXTRACRAN SEL INTERNAL CAROTID BILAT MOD SED  03/07/2020  . IR ANGIO VERTEBRAL SEL SUBCLAVIAN INNOMINATE UNI R MOD SED  03/07/2020  . IR ANGIO VERTEBRAL SEL VERTEBRAL UNI L MOD SED  03/07/2020  . IR US GUIDE VASC ACCESS RIGHT  03/07/2020  . LUMBAR LAMINECTOMY    . peptic ulcer repair      Social History   Socioeconomic History  . Marital status: Unknown    Spouse name: Not on file  . Number of children: Not on file  .  Years of education: Not on file  . Highest education level: Not on file  Occupational History  . Not on file  Tobacco Use  . Smoking status: Former Smoker    Packs/day: 1.00    Years: 25.00    Pack years: 25.00    Types: Cigarettes    Quit date: 12/25/2019    Years since quitting: 0.3  . Smokeless tobacco: Never Used  Vaping Use  . Vaping Use: Never used  Substance and Sexual Activity  . Alcohol use: Not Currently  . Drug use: Never  . Sexual activity: Not Currently    Birth control/protection: None  Other Topics Concern  . Not on file  Social History Narrative  . Not on file   Social Determinants of Health   Financial  Resource Strain:   . Difficulty of Paying Living Expenses:   Food Insecurity:   . Worried About Programme researcher, broadcasting/film/videounning Out of Food in the Last Year:   . Baristaan Out of Food in the Last Year:   Transportation Needs:   . Freight forwarderLack of Transportation (Medical):   Marland Kitchen. Lack of Transportation (Non-Medical):   Physical Activity:   . Days of Exercise per Week:   . Minutes of Exercise per Session:   Stress:   . Feeling of Stress :   Social Connections:   . Frequency of Communication with Friends and Family:   . Frequency of Social Gatherings with Friends and Family:   . Attends Religious Services:   . Active Member of Clubs or Organizations:   . Attends BankerClub or Organization Meetings:   Marland Kitchen. Marital Status:   Intimate Partner Violence:   . Fear of Current or Ex-Partner:   . Emotionally Abused:   Marland Kitchen. Physically Abused:   . Sexually Abused:     Allergies  Allergen Reactions  . Penicillins Other (See Comments)    Reaction unknown -- occurred in childhood  . Sulfa Antibiotics Other (See Comments)    Reaction unknown -- occurred in childhood    Family History  Problem Relation Age of Onset  . Hypertension Mother   . Hypertension Father     Prior to Admission medications   Medication Sig Start Date End Date Taking? Authorizing Provider  cilostazol (PLETAL) 50 MG tablet Take 50 mg by mouth 2 (two) times daily.   Yes [provider]  donepezil (ARICEPT) 10 MG tablet Take 10 mg by mouth at bedtime.   Yes [provider]  fenofibrate 54 MG tablet Take 54 mg by mouth daily.   Yes [provider]  fludrocortisone (FLORINEF) 0.1 MG tablet Take 0.1 mg by mouth daily.  04/22/20  Yes [provider]  gabapentin (NEURONTIN) 100 MG capsule Take 100 mg by mouth 2 (two) times daily.   Yes [provider]  levothyroxine (SYNTHROID) 50 MCG tablet Take 50 mcg by mouth daily before breakfast.   Yes [provider]  metFORMIN (GLUCOPHAGE) 500 MG tablet Take 500 mg by mouth in the  morning and at bedtime.   Yes [provider]  Multiple Vitamin (MULTIVITAMIN WITH MINERALS) TABS tablet Take 1 tablet by mouth daily. 03/10/20 06/08/20 Yes Hall, Carole N, DO  sertraline (ZOLOFT) 100 MG tablet Take 100 mg by mouth daily. 04/22/20  Yes [provider]  simvastatin (ZOCOR) 40 MG tablet Take 40 mg by mouth daily.   Yes [provider]  tamsulosin (FLOMAX) 0.4 MG CAPS capsule Take 0.4 mg by mouth daily.   Yes [provider]  Physical Exam: Vitals:   05/07/20 1600 05/07/20 1715 05/07/20 1730 05/07/20 1821  BP: 127/80 (!) 153/87 (!) 148/134   Pulse: 93   (!) 103  Resp: 15 (!) 21 16 14   Temp:      TempSrc:      SpO2: 100%   100%  Weight:      Height:         . General:  Appears calm and comfortable and is NAD, somnolent and not overly invested in the conversation; frail and chronically ill-appearing . Eyes:  PERRL, EOMI, normal lids, iris . ENT:  grossly normal hearing, lips & tongue, mmm . Neck:  no LAD, masses or thyromegaly . Cardiovascular:  Irregularly irregular without tachycardia, no m/r/g. No LE edema.  Respiratory:   CTA bilaterally with no wheezes/rales/rhonchi.  Normal respiratory effort. . Abdomen:  soft, NT, ND, NABS . Skin:  no rash or induration seen on limited exam . Musculoskeletal:  grossly normal tone BUE/BLE, good ROM, no bony abnormality . Lower extremity:  No LE edema.  Limited foot exam with no ulcerations.  2+ distal pulses. Marland Kitchen Psychiatric: blunted mood and affect, speech sparse but appropriate . Neurologic:  CN 2-12 grossly intact, moves all extremities in coordinated fashion    Radiological Exams on Admission: DG Chest Port 1 View  Result Date: 05/07/2020 CLINICAL DATA:  Weakness today. EXAM: PORTABLE CHEST 1 VIEW COMPARISON:  PA and lateral chest 03/04/2020. FINDINGS: The lungs are clear. Aortic stent graft remains in place in the descending thoracic aorta. A smoothly marginated ovoid density along the  lateral margin of the stent is unchanged and likely represents excluded aneurysm. Atherosclerosis noted. Heart size is normal. No acute or focal bony abnormality. IMPRESSION: No acute disease. Aortic Atherosclerosis (ICD10-I70.0). Electronically Signed   By: 05/04/2020 M.D.   On: 05/07/2020 12:59    EKG: Independently reviewed.  Afib with rate 102; nonspecific ST changes with no evidence of acute ischemia   Labs on Admission: I have personally reviewed the available labs and imaging studies at the time of the admission.  Pertinent labs:   CMP pending - not ordered by EDP WBC 8.4 Hgb 10.9 TSH 3.554 HS troponin 14 UA: small Hgb, large LE, 100 protein, many bacteria, >50 WBC   Assessment/Plan Principal Problem:   New onset atrial fibrillation (HCC) Active Problems:   HTN (hypertension)   DM (diabetes mellitus), secondary, uncontrolled, with neurologic complications (HCC)   POTS (postural orthostatic tachycardia syndrome)   BPH with urinary obstruction   Failure to thrive in adult    New onset afib  -Patient presenting with new-onset/previously undiagnosed afib.  -EKG from prior admission on 5/10 is suspicious for afib, as well -Based on his age and overall functional status, he appears to be a poor candidate for aggressive therapy with medications or with [facility] -Will admit to telemetry for now; patient needs admission based on afib associated with a high-risk situation (failure to thrive) and he appears likely to need >2MN in the hospital for further evaluation and treatment. -HS troponin negative; will not repeat. -Cardiology consulted -Cardiology has requested Echocardiogram for further evaluation  -Cards recommends watchful waiting at this time with careful consideration of addition of metoprolol if needed for rate control -Patient has high risk for falls (has had recurrent falls); therefore is not a good candidate for anticoagulation.   Failure to thrive with underlying  dementia -Patient has known dementia but has also had progressive weakness, weight loss, decreased appetite, and diarrhea (  see below) -Will admit with PT/OT/ST/nutrition evaluations -He has been living in independent living but does not appear to be appropriate for this level of care; daughter would prefer to have him in ALF at [facility] rather than moving facilities and/or going to SNF -Will request palliative care evaluation for goals of care -TOC team consult requested. -Daughter has requested that Zoloft dose be decreased back to 50 mg daily (no improvement and more somnolence on 100 mg daily) -Continue Aricept  Malnutrition -Body mass index is 20.51 kg/m..  -The patient has at least 2 indicators for malnutrition (insufficient energy intake, weight loss, loss of muscle mass, loss of subcutaneous fat, diminished functional status).  -This is likely due to starvation related/chronic disease.   -Will obtain a nutrition consult for further recommendations. -Will start Megace at daughter request  Diarrhea -Stool studies were ordered by EDP including C. Diff -Upon further discussion, it sounds like he is having intermittent loose stools once or twice a day -His PCP has recommended adding fiber and this is reasonable -Will also add Lomotil for presumed functional diarrhea once stool studies have been collected -Nutrition consult, as above - daughter thinks Glucerna made diarrhea worse  POTS syndrome -Diagnosed during his May hospitalization -He was started on Florinef and compression stockings with some improvement in orthostatic vital signs -However, he continues to be weak and unsteady -Continue Florinef -Stop Flomax due to orthostasis -Other evaluation/management as above  BPH with urinary retention -Continue straight in and out cath 3 times daily -Since most BPH medications contribute to orthostasis/hypotension, will change to dutasteride - one of the lowest risk medications for  this issue -Follow-up with urology outpatient  Severe intracranial atherosclerosis -Continue statin and dual antiplatelet therapy (aspirin and Pletal) for now  Stage 3a CKD -Creatinine appears to be at/better than baseline  -Continue to avoid nephrotoxins  HTN -Continue to hold BP medications given orthostasis  HLD -Continue Zocor and fenofibrate for now, but consider d/c in the future due to limited utility in the current circumstance  DM -Recent A1c was 7.5 on 5/11 -Hold Glucophage -Realistically, his glycemic goal should be 120-200, with avoidance of hypoglycemia -Will hold SSI at this time -Continue Neurontin  Hypothyroidism -Recent normal TSH and repeated and normal today -Continue Synthroid at current dose at this time    Note: This patient has been tested and is pending for the novel coronavirus COVID-19.    DVT prophylaxis: Lovenox  Code Status:  DNR - confirmed with daughter Family Communication: Step-daughter was present throughout evaluation Disposition Plan:  The patient is from: ILF  Anticipated d/c is to: ALF vs. SNF  Anticipated d/c date will depend on clinical response to treatment, likely 2-3 days  Patient is currently: acutely ill Consults called: Cardiology; Palliative Care; PT/OT/Nutrition/TOC team  Admission status: Admit - It is my clinical opinion that admission to INPATIENT is reasonable and necessary because of the expectation that this patient will require hospital care that crosses at least 2 midnights to treat this condition based on the medical complexity of the problems presented.  Given the aforementioned information, the predictability of an adverse outcome is felt to be significant.         Jonah Blue MD [facility]   How to contact the [facility] Attending or Consulting provider 7A - 7P or covering provider during after hours 7P -7A, for this patient?  1. Check the care team in [facility] and look for a) attending/consulting  TR[facility] provider listed and b) the TRH  team listed 2. Log into [email or web address] and use [facility] universal password to access. If you do not have the password, please contact the hospital operator. 3. Locate the [facility] provider you are looking for under [facility] and page to a number that you can be directly reached. 4. If you still have difficulty reaching the provider, please page the [facility] (Director on Call) for the Hospitalists listed on amion for assistance.   05/07/2020, 6:56 PM

## 2020-05-07 NOTE — ED Triage Notes (Signed)
Pt presents from MD office for weakness (unable ambulate, usually would), decreased app x1 week, rapid HR, diarrhea for weeks. H/o POTS, no H/o afib.  EMS exam: afib rate 90-130, 97%RA, pinpoint pupils, BP 98/66 improved to 118/70 with 500cc NS. CBG 213.   Pt has dementia, at baseline orientation

## 2020-05-07 NOTE — ED Provider Notes (Signed)
[facility]ENC[facility] Provider Note   CSN: [id number] Arrival date & time: 05/07/20  1221     History Chief Complaint  Patient presents with  . Weakness  . Tachycardia    [name] is a 86 y.o. male.  HPI   This patient is a 86 year old male, history of dementia, diabetes, pots syndrome but also has hypertension and a known aneurysm with thyroid disease.  He presents from his doctor's office after he was found to be hypotensive and in new onset atrial fibrillation.  I have reviewed the medical record including the notes from the patient's visit at his doctor's office today.  According to their notes this patient was accompanied by his daughter, he had a recent birthday 2 days ago and on the birthday paramedics were called because of a fall, he was found to be in atrial fibrillation but refused to go to the hospital because it was his birthday.  He has had decreased oral intake for the last month and diarrhea daily for a month.  There has been approximately 40 pounds of weight loss since April extreme weakness and difficulty getting around.  He is currently living in independent living at [facility].  The patient is unable to add any information to this as he has dementia.  Level 5 caveat applies.  Additional records reviewed including the lab work which was done today.  His metabolic panel showed hyponatremia 131, hypokalemia 3.1, leukocytosis 11,600.    Past Medical History:  Diagnosis Date  . AAA (abdominal aortic aneurysm) (HCC)   . BPH (benign prostatic hyperplasia)   . Dementia (HCC)   . Diabetes mellitus without complication (HCC)   . Elevated PSA   . Hypertension   . POTS (postural orthostatic tachycardia syndrome)   . Thyroid disease     Patient Active Problem List   Diagnosis Date Noted  . TIA (transient ischemic attack) 03/04/2020  . HTN (hypertension) 03/04/2020  . DM (diabetes mellitus), secondary, uncontrolled, with  neurologic complications (HCC) 03/04/2020    Past Surgical History:  Procedure Laterality Date  . ABDOMINAL AORTIC ANEURYSM REPAIR    . APPENDECTOMY    . IR ANGIO INTRA EXTRACRAN SEL INTERNAL CAROTID BILAT MOD SED  03/07/2020  . IR ANGIO VERTEBRAL SEL SUBCLAVIAN INNOMINATE UNI R MOD SED  03/07/2020  . IR ANGIO VERTEBRAL SEL VERTEBRAL UNI L MOD SED  03/07/2020  . IR US GUIDE VASC ACCESS RIGHT  03/07/2020  . LUMBAR LAMINECTOMY    . peptic ulcer repair         Family History  Problem Relation Age of Onset  . Hypertension Mother   . Hypertension Father     Social History   Tobacco Use  . Smoking status: Former Smoker    Packs/day: 1.00    Years: 25.00    Pack years: 25.00    Types: Cigarettes    Quit date: 12/25/2019    Years since quitting: 0.3  . Smokeless tobacco: Never Used  Vaping Use  . Vaping Use: Never used  Substance Use Topics  . Alcohol use: Not Currently  . Drug use: Never    Home Medications Prior to Admission medications   Medication Sig Start Date End Date Taking? Authorizing Provider  cilostazol (PLETAL) 50 MG tablet Take 50 mg by mouth 2 (two) times daily.   Yes [provider]  donepezil (ARICEPT) 10 MG tablet Take 10 mg by mouth at bedtime.   Yes [provider]  fenofibrate  54 MG tablet Take 54 mg by mouth daily.   Yes [provider]  fludrocortisone (FLORINEF) 0.1 MG tablet Take 0.1 mg by mouth daily.  04/22/20  Yes [provider]  gabapentin (NEURONTIN) 100 MG capsule Take 100 mg by mouth 2 (two) times daily.   Yes [provider]  levothyroxine (SYNTHROID) 50 MCG tablet Take 50 mcg by mouth daily before breakfast.   Yes [provider]  metFORMIN (GLUCOPHAGE) 500 MG tablet Take 500 mg by mouth in the morning and at bedtime.   Yes [provider]  Multiple Vitamin (MULTIVITAMIN WITH MINERALS) TABS tablet Take 1 tablet by mouth daily. 03/10/20 06/08/20 Yes Hall, Carole N, DO  sertraline  (ZOLOFT) 100 MG tablet Take 100 mg by mouth daily. 04/22/20  Yes [provider]  simvastatin (ZOCOR) 40 MG tablet Take 40 mg by mouth daily.   Yes [provider]  tamsulosin (FLOMAX) 0.4 MG CAPS capsule Take 0.4 mg by mouth daily.   Yes [provider]    Allergies    Penicillins and Sulfa antibiotics  Review of Systems   Review of Systems  All other systems reviewed and are negative.   Physical Exam Updated Vital Signs BP 137/83   Pulse 91   Temp 98.2 F (36.8 C) (Oral)   Resp (!) 24   Ht 1.803 m (5\' 11" )   Wt 66.7 kg   SpO2 100%   BMI 20.51 kg/m   Physical Exam Vitals and nursing note reviewed.  Constitutional:      General: He is not in acute distress.    Appearance: He is well-developed.  HENT:     Head: Normocephalic and atraumatic.     Mouth/Throat:     Pharynx: No oropharyngeal exudate.  Eyes:     General: No scleral icterus.       Right eye: No discharge.        Left eye: No discharge.     Conjunctiva/sclera: Conjunctivae normal.     Pupils: Pupils are equal, round, and reactive to light.  Neck:     Thyroid: No thyromegaly.     Vascular: No JVD.  Cardiovascular:     Rate and Rhythm: Tachycardia present. Rhythm irregular.     Heart sounds: Normal heart sounds. No murmur heard.  No friction rub. No gallop.   Pulmonary:     Effort: Pulmonary effort is normal. No respiratory distress.     Breath sounds: Normal breath sounds. No wheezing or rales.  Abdominal:     General: Bowel sounds are normal. There is no distension.     Palpations: Abdomen is soft. There is no mass.     Tenderness: There is no abdominal tenderness.  Musculoskeletal:        General: No tenderness. Normal range of motion.     Cervical back: Normal range of motion and neck supple.  Lymphadenopathy:     Cervical: No cervical adenopathy.  Skin:    General: Skin is warm and dry.     Findings: No erythema or rash.  Neurological:     Mental Status: He is  alert.     Coordination: Coordination normal.  Psychiatric:        Behavior: Behavior normal.     ED Results / Procedures / Treatments   Labs (all labs ordered are listed, but only abnormal results are displayed) Labs Reviewed  URINALYSIS, ROUTINE W REFLEX MICROSCOPIC - Abnormal; Notable for the following components:  Result Value   APPearance CLOUDY (*)    Hgb urine dipstick SMALL (*)    Protein, ur 100 (*)    Leukocytes,Ua LARGE (*)    WBC, UA >50 (*)    Bacteria, UA MANY (*)    Non Squamous Epithelial 0-5 (*)    All other components within normal limits  URINE CULTURE  GASTROINTESTINAL PANEL BY PCR, STOOL (REPLACES STOOL CULTURE)  C DIFFICILE QUICK SCREEN W PCR REFLEX  TSH  POC OCCULT BLOOD, ED  TROPONIN I (HIGH SENSITIVITY)    EKG EKG Interpretation  Date/Time:  Tuesday May 07 2020 12:29:04 EDT Ventricular Rate:  102 PR Interval:    QRS Duration: 136 QT Interval:  397 QTC Calculation: 518 R Axis:   -90 Text Interpretation: Sinus tachycardia with irregular rate RBBB and LAFB ST elevation, consider inferior injury since last tracing no significant change Confirmed by Eber Hong ([id number]) on 05/07/2020 1:57:32 PM   Radiology DG Chest Port 1 View  Result Date: 05/07/2020 CLINICAL DATA:  Weakness today. EXAM: PORTABLE CHEST 1 VIEW COMPARISON:  PA and lateral chest 03/04/2020. FINDINGS: The lungs are clear. Aortic stent graft remains in place in the descending thoracic aorta. A smoothly marginated ovoid density along the lateral margin of the stent is unchanged and likely represents excluded aneurysm. Atherosclerosis noted. Heart size is normal. No acute or focal bony abnormality. IMPRESSION: No acute disease. Aortic Atherosclerosis (ICD10-I70.0). Electronically Signed   By: Drusilla Kanner M.D.   On: 05/07/2020 12:59    Procedures Procedures (including critical care time)  Medications Ordered in ED Medications - No data to display  ED Course  I have  reviewed the triage vital signs and the nursing notes.  Pertinent labs & imaging results that were available during my care of the patient were reviewed by me and considered in my medical decision making (see chart for details).    MDM Rules/Calculators/A&P                          The patient is in atrial fibrillation clinically, pulses are present at the radial arteries, there is no edema, no JVD, clear lung sounds, soft abdomen.  Mucous membranes appear normal, mental status is alert, pleasantly confused at his baseline.  His progressive weakness is suggestive that he would not be functional and in independent living situation.  Will check a chest x-ray and a urinalysis as well as a magnesium level and a TSH, CT scan of the brain may be helpful as well given his progressive weakness with some history of falls.  Thankfully he is not in renal failure and has no significant electrolyte abnormalities  The patient's family member is now here, it is his daughter-in-law, she reports that over the last month and a half or so he has had a significant decline with severe weakness and now is hardly able to ambulate.  He is having frequent falls, has been evaluated multiple times for these falls, has been diagnosed with pots syndrome and is currently on Florinef.  He has had poor appetite, not taking very much, and has had chronic diarrhea for the last month which is making him weaker and weaker.  He does self cath at least twice a day.  We will add on urine culture, urinalysis will need to be obtained by in and out catheterization and the patient has had recent stenting of his aorta, will need CT scans of his chest abdomen and pelvis as  the daughter-in-law states that this aneurysm extends into the chest but has not been evaluated recently.  Due to the patient's decompensation I doubt that he can continue at his facility without temporary rehab or skilled nursing, will consult with hospitalist, will consult  with cardiology regarding A. fib, likely needs to be admitted to the hospital.  D/w Dr. Ophelia Charter who will admit  Final Clinical Impression(s) / ED Diagnoses Final diagnoses:  Atrial fibrillation, unspecified type (HCC)  Hyponatremia  Hypokalemia  Failure to thrive in adult      Eber Hong, MD 05/07/20 1545

## 2020-05-08 DIAGNOSIS — E1349 Other specified diabetes mellitus with other diabetic neurological complication: Secondary | ICD-10-CM

## 2020-05-08 DIAGNOSIS — Z515 Encounter for palliative care: Secondary | ICD-10-CM

## 2020-05-08 DIAGNOSIS — L8990 Pressure ulcer of unspecified site, unspecified stage: Secondary | ICD-10-CM | POA: Insufficient documentation

## 2020-05-08 DIAGNOSIS — I498 Other specified cardiac arrhythmias: Secondary | ICD-10-CM

## 2020-05-08 DIAGNOSIS — R627 Adult failure to thrive: Secondary | ICD-10-CM

## 2020-05-08 DIAGNOSIS — N401 Enlarged prostate with lower urinary tract symptoms: Secondary | ICD-10-CM

## 2020-05-08 DIAGNOSIS — E1365 Other specified diabetes mellitus with hyperglycemia: Secondary | ICD-10-CM

## 2020-05-08 DIAGNOSIS — N138 Other obstructive and reflux uropathy: Secondary | ICD-10-CM

## 2020-05-08 LAB — GASTROINTESTINAL PANEL BY PCR, STOOL (REPLACES STOOL CULTURE)

## 2020-05-08 LAB — C DIFFICILE QUICK SCREEN W PCR REFLEX
C Diff antigen: NEGATIVE
C Diff interpretation: NOT DETECTED
C Diff toxin: NEGATIVE

## 2020-05-08 LAB — BASIC METABOLIC PANEL
Anion gap: 10 (ref 5–15)
BUN: 8 mg/dL (ref 8–23)
CO2: 27 mmol/L (ref 22–32)
Calcium: 8.7 mg/dL — ABNORMAL LOW (ref 8.9–10.3)
Chloride: 96 mmol/L — ABNORMAL LOW (ref 98–111)
Creatinine, Ser: 0.79 mg/dL (ref 0.61–1.24)
GFR calc Af Amer: >60 mL/min (ref >60)
GFR calc non Af Amer: >60 mL/min (ref >60)
Glucose, Bld: 244 mg/dL — ABNORMAL HIGH (ref 70–99)
Potassium: 3.4 mmol/L — ABNORMAL LOW (ref 3.5–5.1)
Sodium: 133 mmol/L — ABNORMAL LOW (ref 135–145)

## 2020-05-08 MED ORDER — MEGESTROL ACETATE 400 MG/10ML PO SUSP
400.0000 mg | Freq: Every day | ORAL | Status: DC
  Administered 2020-05-08 – 2020-05-09 (×2): 400 mg via ORAL
  Filled 2020-05-08 (×3): qty 10

## 2020-05-08 MED ORDER — SODIUM CHLORIDE 0.9 % IV SOLN
1.0000 g | INTRAVENOUS | Status: DC
  Administered 2020-05-08: 1 g via INTRAVENOUS
  Filled 2020-05-08 (×2): qty 10

## 2020-05-08 MED ORDER — METOPROLOL TARTRATE 12.5 MG HALF TABLET
12.5000 mg | ORAL_TABLET | Freq: Two times a day (BID) | ORAL | Status: DC
  Administered 2020-05-08 – 2020-05-09 (×3): 12.5 mg via ORAL
  Filled 2020-05-08 (×3): qty 1

## 2020-05-08 MED ORDER — SODIUM CHLORIDE 0.9 % IV SOLN: INTRAVENOUS | Status: DC

## 2020-05-08 MED ORDER — ENSURE ENLIVE PO LIQD
237.0000 mL | Freq: Two times a day (BID) | ORAL | Status: DC
  Administered 2020-05-08 – 2020-05-09 (×2): 237 mL via ORAL

## 2020-05-08 MED ORDER — ADULT MULTIVITAMIN W/MINERALS CH
1.0000 | ORAL_TABLET | Freq: Every day | ORAL | Status: DC
  Administered 2020-05-08 – 2020-05-09 (×2): 1 via ORAL
  Filled 2020-05-08 (×2): qty 1

## 2020-05-08 NOTE — Progress Notes (Signed)
Palliative-   Consult received and chart reviewed. Patient being evaluated by PT on my initial attempt to see. Called listed contact- Weldon Picking- left message requesting return call.   Ocie Bob, AGNP-C Palliative Medicine  Please call Palliative Medicine team phone with any questions [phone]. For individual providers please see AMION.  No charge note

## 2020-05-08 NOTE — TOC Initial Note (Signed)
Transition of Care [facility]) - Initial/Assessment Note    Patient Details  Name: Joe Peters MRN: [id number] Date of Birth: 1928/01/17  Transition of Care [facility]) CM/SW Contact:    Leone Haven, RN Phone Number: 05/08/2020, 4:44 PM  Clinical Narrative:                 NCM received consult for home hospice, [place],  [place] contacted daughter , Zella Ball, offered choice for home hospice, she states she wants to use who [facility] prefers, NCM contacted [facility] and they said [facility].  NCM notified daughter they prefer [facility], she states lets go with them.  NCM made referral to [facility] for home hospice with Trena Platt.  NCM gave her the daughter's contact information.   Expected Discharge Plan: Home w Hospice Care Barriers to Discharge: Continued Medical Work up   Patient Goals and CMS Choice   CMS Medicare.gov Compare Post Acute Care list provided to:: Patient Represenative (must comment) Choice offered to / list presented to : Adult Children  Expected Discharge Plan and Services Expected Discharge Plan: Home w Hospice Care   Discharge Planning Services: CM Consult   Living arrangements for the past 2 months: Independent Living Facility                   DME Agency: NA       HH Arranged: RN HH Agency:  [employer]) Date HH Agency Contacted: 05/08/20 Time HH Agency Contacted: 1643 Representative spoke with at [facility] Agency: Trena Platt  Prior Living Arrangements/Services Living arrangements for the past 2 months: Independent Living Facility Lives with:: Self Patient language and need for interpreter reviewed:: Yes        Need for Family Participation in Patient Care: Yes (Comment) Care giver support system in place?: Yes (comment)   Criminal Activity/Legal Involvement Pertinent to Current Situation/Hospitalization: No - Comment as needed  Activities of Daily Living      Permission Sought/Granted                   Emotional Assessment              Admission diagnosis:  Atrial fibrillation (HCC) [I48.91] Hypokalemia [E87.6] Hyponatremia [E87.1] Failure to thrive in adult [R62.7] Atrial fibrillation, unspecified type [facility]) [I48.91] Patient Active Problem List   Diagnosis Date Noted  . Pressure injury of skin 05/08/2020  . New onset atrial fibrillation (HCC) 05/07/2020  . POTS (postural orthostatic tachycardia syndrome) 05/07/2020  . BPH with urinary obstruction 05/07/2020  . Failure to thrive in adult 05/07/2020  . TIA (transient ischemic attack) 03/04/2020  . HTN (hypertension) 03/04/2020  . DM (diabetes mellitus), secondary, uncontrolled, with neurologic complications (HCC) 03/04/2020   PCP:  Martha Clan, MD Pharmacy:   [facility] DELIVERY - [place], [place] - [address] [place] [address] Phone: [phone] Fax: [phone]     Social Determinants of Health (SDOH) Interventions    Readmission Risk Interventions No flowsheet data found.

## 2020-05-08 NOTE — Progress Notes (Signed)
Pt transferred from ED to 4E09, appeared alert and oriented x 4. On room air SPO2 98%,RR 16, Temp 97.70F, called CCMD with 2nd person verified EKG showed NSR with frequent PAC and sometimes Atrial fib, HR 90s-110 , BP 143/73 mmHg. No acute distress at arrival.  Pt stated feeling bladder distention, reviewed last in and out cath at 1530, so in and out cath performed, got urine output 450 ml yellow and cloudy. Enteric precaution initiated, Pt stated his last watery BM was today. He had smear stool at arrival, we're unable to send enough specimen to the lab for C-diff testing.  Pt' sacrum presented with stage 2 pressure ulcer. Cleaned and sacral foam applied. Encouraged position changed q 2 hrs.   CHG bath given, room and equipments oriented, call bell within reach. We will continue to monitor.  Filiberto Pinks, RN

## 2020-05-08 NOTE — Evaluation (Signed)
Physical Therapy Evaluation Patient Details Name: Joe Peters MRN: [id number] DOB: 02-May-1928 Today's Date: 05/08/2020   History of Present Illness  86 y.o. male presenting with dizziness s/p fall at [facility]. Patient found to have new onset A-fib. PMHx significant for dementia, HTN, DM, POTS, IDDM, PVD, diabetic neuropathy, TIA, AAA rupture s/p repair, and HLD.  Clinical Impression   Pt admitted with above diagnosis. Comes from his independent living apartment at [facility]; at baseline, will walk to dining hall with RW; reports lately he has had more trouble with amb; Presents to PT with tachycardia with standing activity/amb, generalized weakness, decr activity tolerance;  Pt currently with functional limitations due to the deficits listed below (see PT Problem List). Pt will benefit from skilled PT to increase their independence and safety with mobility to allow discharge to the venue listed below.       Follow Up Recommendations SNF    Equipment Recommendations  3in1 (PT)    Recommendations for Other Services OT consult (as ordered)     Precautions / Restrictions Precautions Precautions: Fall Precaution Comments: Monitor BP (Pt. with dx. of POTS) Restrictions Weight Bearing Restrictions: No      Mobility  Bed Mobility Overal bed mobility: Needs Assistance Bed Mobility: Supine to Sit     Supine to sit: Min assist     General bed mobility comments: Min handheld assist to pull to sit  Transfers Overall transfer level: Needs assistance Equipment used: Rolling walker (2 wheeled) Transfers: Sit to/from Stand Sit to Stand: Min assist         General transfer comment: Slow rise, but not needing physical assist; Cues for hand placement and safety; Min assist to steady as pt moved hands to RW  Ambulation/Gait Ambulation/Gait assistance: Min guard;+2 safety/equipment Gait Distance (Feet): 20 Feet Assistive device: Rolling walker (2 wheeled) Gait  Pattern/deviations: Step-through pattern;Decreased step length - right;Decreased step length - left;Decreased stride length     General Gait Details: Cues to self-monitor for activity tolerance; HR tachy with standing/walking, with range from about 130s to 150 observed highest; denies lightheadedness  Stairs            Wheelchair Mobility    Modified Rankin (Stroke Patients Only)       Balance Overall balance assessment: Needs assistance   Sitting balance-Leahy Scale: Fair       Standing balance-Leahy Scale: Poor (approaching Fair)                               Pertinent Vitals/Pain Pain Assessment: No/denies pain    Home Living Family/patient expects to be discharged to:: Private residence Living Arrangements: Other (Comment) ([facility]) Available Help at Discharge: Available PRN/intermittently Cabin crew) Type of Home: Independent living facility (Plans to transition to ALF when bed becomes available)       Home Layout: One level Home Equipment: Walker - 4 wheels;Shower seat Additional Comments: Pt has rollator    Prior Function Level of Independence: Needs assistance   Gait / Transfers Assistance Needed: RW intermittently  ADL's / Homemaking Assistance Needed: Assist for bathing. Able to dress himself. Meals in dining room at facility.         Hand Dominance   Dominant Hand: Right    Extremity/Trunk Assessment   Upper Extremity Assessment Upper Extremity Assessment: Defer to OT evaluation    Lower Extremity Assessment Lower Extremity Assessment: Generalized weakness    Cervical / Trunk  Assessment Cervical / Trunk Assessment: (P) Kyphotic (Forward head)  Communication   Communication: No difficulties  Cognition Arousal/Alertness: Awake/alert Behavior During Therapy: WFL for tasks assessed/performed Overall Cognitive Status: No family/caregiver present to determine baseline cognitive functioning                                  General Comments: Overall WFL for simple mobility tasks; would benefit from further evaluation      General Comments General comments (skin integrity, edema, etc.): Session conducted on Room Air, and o2 sats stayed 90% or above; HR ranged 100-120s with seated activity and ranged 130s to 150s with amb    Exercises     Assessment/Plan    PT Assessment Patient needs continued PT services  PT Problem List Cardiopulmonary status limiting activity;Decreased activity tolerance;Decreased balance;Decreased strength;Decreased mobility;Decreased knowledge of use of DME;Decreased safety awareness;Decreased knowledge of precautions       PT Treatment Interventions DME instruction;Gait training;Stair training;Functional mobility training;Therapeutic activities;Therapeutic exercise;Balance training;Patient/family education    PT Goals (Current goals can be found in the Care Plan section)  Acute Rehab PT Goals Patient Stated Goal: expressed need to go to bathroom PT Goal Formulation: With patient Time For Goal Achievement: 05/22/20 Potential to Achieve Goals: Good    Frequency Min 3X/week   Barriers to discharge        Co-evaluation               AM-PAC PT "6 Clicks" Mobility  Outcome Measure Help needed turning from your back to your side while in a flat bed without using bedrails?: None Help needed moving from lying on your back to sitting on the side of a flat bed without using bedrails?: A Little Help needed moving to and from a bed to a chair (including a wheelchair)?: A Little Help needed standing up from a chair using your arms (e.g., wheelchair or bedside chair)?: A Little Help needed to walk in hospital room?: A Little Help needed climbing 3-5 steps with a railing? : A Little 6 Click Score: 19    End of Session Equipment Utilized During Treatment: Gait belt Activity Tolerance: Patient tolerated treatment well Patient left: in  chair;with call bell/phone within reach;with chair alarm set Nurse Communication: Mobility status PT Visit Diagnosis: Other abnormalities of gait and mobility (R26.89);Difficulty in walking, not elsewhere classified (R26.2);Other (comment) (decr functional capacity)    Time: 1225-1301 PT Time Calculation (min) (ACUTE ONLY): 36 min   Charges:   PT Evaluation $PT Eval Moderate Complexity: 1 Mod          Van Clines, [place]  Acute Rehabilitation Services Pager [phone] Office [phone]   Levi Aland 05/08/2020, 2:33 PM

## 2020-05-08 NOTE — Progress Notes (Signed)
Palliative-   Brief note- full note to follow-   Discussed patient's status and goals of care with daughter Zella Ball.  Goals are to ensure his comfort, return to [facility], not return to hospital.  Agree to referral for Hospice services at [facility].   Ocie Bob, AGNP-C Palliative Medicine  Please call Palliative Medicine team phone with any questions [phone]. For individual providers please see AMION.  No charge

## 2020-05-08 NOTE — Progress Notes (Signed)
PROGRESS NOTE    Joe Peters  YTK:[id number] DOB: 24-May-1928 DOA: 05/07/2020 PCP: Martha Clan, MD    Brief Narrative:  Patient admitted to the hospital with working diagnosis of new onset atrial fibrillation in the setting of urinary tract infection.   86 year old male with a past medical history for hypertension, diabetes mellitus, abdominal aortic aneurysm, chronic urinary retention and dementia.  Patient lives at a independent living facility, patient sustained multiple falls 4 days prior to hospitalization.  He has been experiencing diarrhea for 2 weeks. Recent hospitalization for autonomic dysfunction.  On his initial physical examination blood pressure 127/80, heart rate 103, respiratory 21, oxygen saturation 100%.  Heart S1-S2 present, irregularly irregular, lungs clear to auscultation bilaterally, abdomen soft, no lower extremity edema. Sodium 131, potassium 3.1, chloride 95 bicarb 24, glucose 181, BUN 15, creatinine 0.99, AST 15, ALT 13, white count 8.4, hemoglobin 10.9, hematocrit 33.2, platelets 222.  SARS COVID-19 negative.  Urinalysis more than 50 white cells, 0-5 red cells.  Stool negative for C. difficile.  CT of the abdomen pelvis with diffuse bladder wall thickening, trabeculation and perivascular fat stranding suspicion for cystitis.  Enlarged prostate, positive cholelithiasis.  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  Positive diverticulosis.  CT chest with descending aortic aneurysm with stent graft in place.  Left posterior 11th and 12th rib fractures. EKG-102 bpm, left axis deviation, left anterior fascicular block, right bundle branch block, atrial fibrillation rhythm, no significant ST segment T wave changes.    Assessment & Plan:   Principal Problem:   New onset atrial fibrillation (HCC) Active Problems:   HTN (hypertension)   DM (diabetes mellitus), secondary, uncontrolled, with neurologic complications (HCC)   POTS (postural orthostatic  tachycardia syndrome)   BPH with urinary obstruction   Failure to thrive in adult   Pressure injury of skin   1. New onset atrial fibrillation. Patient continue to get uncontrolled heart rate on ambulation. At rest HR has been 76 to 109 bpm. Blood pressure 139/56 mmHg.   Will start patient on metoprolol 12.5 mg po bid for better rate control, continue volume resuscitation with isotonic solutions. Patient not candidate for anticoagulation due to fall risk and dementia.   2. Complicated urinary tract infection/ chronic urinary retention. Will continue antibiotic therapy with IV ceftriaxone, follow up on cultures, cell count and temperature curve.   Ordered a bladder catheter for urinary retention. On dutasteride.   3. Diarrhea with hypovolemia. C diff and gi panel negative, will dc contact precautions. Continue Iv fluids and will consult nutrition for assessment.   4. HTN/ orthostatic hypotension. Continue to hold on antihypertensive agents. Continue with florinef for now.   5. T2DM/ dyslipidemia. Continue glucose monitoring and coverage with insulin sliding scale.   Continue with fenofibrate and simvastatin .   6. Hypothyroid. Continue with levothyroxine..   7. CKD stage 3a with hyponatremia and hypokalemia/ non gap metabolic acidosis. Renal function with serum cr at 0,90 with K at 2,9 and serum bicarbonate at 17. Will continue hydration with isotonic saline at 75 ml per H, will add Kcl and will check on MG.  Follow up renal function today and in am.   8. Dementia with moderate calorie protein malnutrition. Consult nutrition, follow with pt and ot recommendations.   Continue with donepezil and ensure.   9. Stage 2 sacrum pressure ulcer (present on admission). Continue with local wound care.   Patient continue to be at high risk for recurrent RVR atrial fibrillation   Status  is: Inpatient  Remains inpatient appropriate because:Inpatient level of care appropriate due to severity of  illness   Dispo: The patient is from: Home              Anticipated d/c is to: Home              Anticipated d/c date is: 2 days              Patient currently is not medically stable to d/c.    DVT prophylaxis: Enoxaparin   Code Status:   dnr   Family Communication:  No family at the bedside      Nutrition Status: Nutrition Problem: Inadequate oral intake Etiology: poor appetite Signs/Symptoms: per patient/family report Interventions: Ensure Enlive (each supplement provides 350kcal and 20 grams of protein), Magic cup, MVI     Skin Documentation: Pressure Injury 05/08/20 Sacrum Medial;Proximal Stage 2 -  Partial thickness loss of dermis presenting as a shallow open injury with a red, pink wound bed without slough. (Active)  05/08/20 0100  Location: Sacrum  Location Orientation: Medial;Proximal  Staging: Stage 2 -  Partial thickness loss of dermis presenting as a shallow open injury with a red, pink wound bed without slough.  Wound Description (Comments):   Present on Admission: Yes     Consultants:   Cardiology      Antimicrobials:   Ceftriaxone     Subjective: Patient continue to have tachycardia while ambulating and feeling lower abdominal distention, no nausea or vomiting, no chest pain.. he had urinary retention.   Objective: Vitals:   05/08/20 0100 05/08/20 0630 05/08/20 0739 05/08/20 1210  BP: (!) 143/73 (!) 185/88 (!) 146/84 (!) 139/56  Pulse: 81 100 67 76  Resp: 16 (!) 21 18 17   Temp: (!) 97.5 F (36.4 C) (!) 96.1 F (35.6 C) 97.6 F (36.4 C) 98 F (36.7 C)  TempSrc: Oral Axillary Oral Oral  SpO2: 100% 100% 100% 100%  Weight:      Height: 5\' 11"  (1.803 m)       Intake/Output Summary (Last 24 hours) at 05/08/2020 1224 Last data filed at 05/08/2020 0739 Gross per 24 hour  Intake 250 ml  Output 775 ml  Net -525 ml   Filed Weights   05/07/20 1233  Weight: 66.7 kg    Examination:   General: Not in pain or dyspnea. Deconditioned    Neurology: Awake and alert, non focal  E ENT: no pallor, no icterus, oral mucosa moist Cardiovascular: No JVD. S1-S2 present, irregularly irregular with no gallops, rubs, or murmurs. Trace lower extremity edema. Pulmonary: positive breath sounds bilaterally, adequate air movement, no wheezing, rhonchi or rales. Gastrointestinal. Abdomen with no organomegaly, non tender, no rebound or guarding Skin. No rashes Musculoskeletal: no joint deformities     Data Reviewed: I have personally reviewed following labs and imaging studies  CBC: Recent Labs  Lab 05/07/20 1801 05/07/20 1823  WBC 8.4  --   HGB 10.9* 10.9*  HCT 33.2* 32.0*  MCV 90.0  --   PLT 222  --    Basic Metabolic Panel: Recent Labs  Lab 05/07/20 1801 05/07/20 1823  NA 131* 131*  K 3.1* 2.9*  CL 97* 95*  CO2 24  --   GLUCOSE 181* 179*  BUN 15 17  CREATININE 0.99 0.90  CALCIUM 8.2*  --    GFR: Estimated Creatinine Clearance: 51.5 mL/min (by C-G formula based on SCr of 0.9 mg/dL). Liver Function Tests: Recent Labs  Lab 05/07/20 1801  AST 15  ALT 13  ALKPHOS 46  BILITOT 0.7  PROT 5.0*  ALBUMIN 2.4*   No results for input(s): LIPASE, AMYLASE in the last 168 hours. No results for input(s): AMMONIA in the last 168 hours. Coagulation Profile: No results for input(s): INR, PROTIME in the last 168 hours. Cardiac Enzymes: No results for input(s): CKTOTAL, CKMB, CKMBINDEX, TROPONINI in the last 168 hours. BNP (last 3 results) No results for input(s): PROBNP in the last 8760 hours. HbA1C: No results for input(s): HGBA1C in the last 72 hours. CBG: No results for input(s): GLUCAP in the last 168 hours. Lipid Profile: No results for input(s): CHOL, HDL, LDLCALC, TRIG, CHOLHDL, LDLDIRECT in the last 72 hours. Thyroid Function Tests: Recent Labs    05/07/20 1238  TSH 3.554   Anemia Panel: No results for input(s): VITAMINB12, FOLATE, FERRITIN, TIBC, IRON, RETICCTPCT in the last 72 hours.    Radiology  Studies: I have reviewed all of the imaging during this hospital visit personally     Scheduled Meds: . cilostazol  50 mg Oral BID  . donepezil  10 mg Oral QHS  . dutasteride  0.5 mg Oral Daily  . enoxaparin (LOVENOX) injection  40 mg Subcutaneous Q24H  . feeding supplement (ENSURE ENLIVE)  237 mL Oral BID BM  . fenofibrate  54 mg Oral Daily  . fludrocortisone  0.1 mg Oral Daily  . gabapentin  100 mg Oral BID  . levothyroxine  50 mcg Oral QAC breakfast  . megestrol  400 mg Oral Daily  . multivitamin with minerals  1 tablet Oral Daily  . psyllium  1 packet Oral Daily  . sertraline  50 mg Oral Daily  . simvastatin  40 mg Oral Daily   Continuous Infusions:   LOS: 1 day        Nasiya Pascual Annett Gula, MD

## 2020-05-08 NOTE — Progress Notes (Addendum)
Patent examiner [facility]) Hospital Liaison: RN note    Notified by Transition of Care Manger of patient/family request for [facility] services at [facility] after discharge. Chart and patient information under review by [facility] physician. Hospice eligibility pending currently.    Spoke with Zella Ball daughter to initiate education related to hospice philosophy, services and team approach to care.  Robin verbalized understanding of information given. Per discussion, plan is for discharge to [facility] by [facility].    Please send signed and completed DNR form home with patient/family. Patient will need prescriptions for discharge comfort medications.     DME needs have been discussed, patient currently has the following equipment in the home: walker.  Patient/family requests the following DME for delivery to the home: Light weight wheelchair with cushion.  [facility] equipment manager has been notified and will contact DME provider to arrange delivery to [facility]. Zella Ball is the family member to contact to arrange time of delivery.     [facility] aware of the above. Please notify [facility] when patient is ready to leave the unit at discharge. (Call [phone] or [phone] after 5pm.) [facility] information and contact numbers given to Robin.      Please call with any hospice related questions.     Thank you for this referral.     Yolande Jolly, RN, [facility] (listed on AMION under [facility])  [phone]

## 2020-05-08 NOTE — Evaluation (Signed)
Occupational Therapy Evaluation Patient Details Name: Joe Peters MRN: [id number] DOB: 10-14-1928 Today's Date: 05/08/2020    History of Present Illness 86 y.o. male presenting with dizziness s/p fall at [facility]. Patient found to have new onset A-fib. PMHx significant for dementia, HTN, DM, POTS, IDDM, PVD, diabetic neuropathy, TIA, AAA rupture s/p repair, and HLD.   Clinical Impression   PTA, patient was living at [facility] and was requiring assistance for bathing and LB dressing. Patient was a household and community ambulator with intermittent use of [id number]. Patient currently presents below baseline level of function requiring Min A grossly for bed mobility, BADLs, and functional mobility with use of RW. HR in 90's-120's at rest and 130's-150's with activity. Max HR 159 with ambulation. Patient would benefit from continued acute OT services to maximize safety and independence with self-care tasks in prep for safe d/c to next level of care. Given patient's current level of function and deficits, patient would benefit from SNF rehab. Patient with request for daughter to be consulted for decision making including d/c disposition.     Follow Up Recommendations  SNF    Equipment Recommendations  Other (comment) (Defer to next level of care. )    Recommendations for Other Services       Precautions / Restrictions Precautions Precautions: Fall Precaution Comments: Monitor BP (Pt. with dx. of POTS) Restrictions Weight Bearing Restrictions: No      Mobility Bed Mobility Overal bed mobility: Needs Assistance Bed Mobility: Supine to Sit     Supine to sit: Min assist     General bed mobility comments: Min handheld assist to pull to sit  Transfers Overall transfer level: Needs assistance Equipment used: Rolling walker (2 wheeled) Transfers: Sit to/from Stand Sit to Stand: Min assist         General transfer comment: Slow rise, but not needing physical assist;  Cues for hand placement and safety; Min assist to steady as pt moved hands to RW    Balance Overall balance assessment: Needs assistance Sitting-balance support: Feet supported Sitting balance-Leahy Scale: Fair     Standing balance support: Bilateral upper extremity supported Standing balance-Leahy Scale: Poor (approaching Fair) Standing balance comment: Heavy reliance w/ BUE on RW                           ADL either performed or assessed with clinical judgement   ADL Overall ADL's : Needs assistance/impaired Eating/Feeding: Set up   Grooming: Minimal assistance;Standing Grooming Details (indicate cue type and reason): Hand hygiene standing at sink level              Lower Body Dressing: Maximal assistance Lower Body Dressing Details (indicate cue type and reason): To don footwear seated in recliner Toilet Transfer: Minimal assistance;BSC;Grab bars Toilet Transfer Details (indicate cue type and reason): Increased time/effort and cues for hand placement.  Toileting- Clothing Manipulation and Hygiene: Minimal assistance Toileting - Clothing Manipulation Details (indicate cue type and reason): For thoroughness after BM.      Functional mobility during ADLs: Minimal assistance;+2 for safety/equipment       Vision         Perception     Praxis      Pertinent Vitals/Pain Pain Assessment: No/denies pain     Hand Dominance Right   Extremity/Trunk Assessment Upper Extremity Assessment Upper Extremity Assessment: Defer to OT evaluation   Lower Extremity Assessment Lower Extremity Assessment: Generalized weakness   Cervical /  Trunk Assessment Cervical / Trunk Assessment: Kyphotic (Forward head)   Communication Communication Communication: No difficulties   Cognition Arousal/Alertness: Awake/alert Behavior During Therapy: WFL for tasks assessed/performed Overall Cognitive Status: No family/caregiver present to determine baseline cognitive  functioning                                 General Comments: Overall WFL for simple mobility tasks; would benefit from further evaluation   General Comments  Session conducted on Room Air, and o2 sats stayed 90% or above; HR ranged 100-120s with seated activity and ranged 130s to 150s with amb    Exercises     Shoulder Instructions      Home Living Family/patient expects to be discharged to:: Private residence Living Arrangements: Other (Comment) ([facility]) Available Help at Discharge: Available PRN/intermittently Cabin crew) Type of Home: Independent living facility (Plans to transition to ALF when bed becomes available)       Home Layout: One level               Home Equipment: Environmental consultant - 4 wheels;Shower seat   Additional Comments: Pt has rollator      Prior Functioning/Environment Level of Independence: Needs assistance  Gait / Transfers Assistance Needed: RW intermittently ADL's / Homemaking Assistance Needed: Assist for bathing. Able to dress himself. Meals in dining room at facility.             OT Problem List: Decreased strength;Decreased activity tolerance;Impaired balance (sitting and/or standing);Decreased safety awareness;Decreased knowledge of use of DME or AE      OT Treatment/Interventions: Self-care/ADL training;Therapeutic exercise;Energy conservation;DME and/or AE instruction;Therapeutic activities;Patient/family education;Balance training    OT Goals(Current goals can be found in the care plan section) Acute Rehab OT Goals Patient Stated Goal: expressed need to go to bathroom OT Goal Formulation: With patient Time For Goal Achievement: 05/22/20 Potential to Achieve Goals: Good ADL Goals Pt Will Perform Grooming: with modified independence;standing Pt Will Perform Lower Body Bathing: with supervision;with adaptive equipment;sitting/lateral leans;sit to/from stand Pt Will Perform Lower Body Dressing: with  modified independence;sitting/lateral leans;sit to/from stand;with adaptive equipment Pt Will Transfer to Toilet: with modified independence;ambulating;bedside commode Pt Will Perform Toileting - Clothing Manipulation and hygiene: with modified independence;sit to/from stand Pt/caregiver will Perform Home Exercise Program: Both right and left upper extremity;Increased strength  OT Frequency: Min 2X/week   Barriers to D/C:            Co-evaluation              AM-PAC OT "6 Clicks" Daily Activity     Outcome Measure Help from another person eating meals?: A Little Help from another person taking care of personal grooming?: A Little Help from another person toileting, which includes using toliet, bedpan, or urinal?: A Little Help from another person bathing (including washing, rinsing, drying)?: A Lot Help from another person to put on and taking off regular upper body clothing?: A Little Help from another person to put on and taking off regular lower body clothing?: A Lot 6 Click Score: 16   End of Session Equipment Utilized During Treatment: Gait belt;Rolling walker Nurse Communication: Mobility status  Activity Tolerance: Patient tolerated treatment well Patient left: in chair;with call bell/phone within reach;with chair alarm set  OT Visit Diagnosis: Unsteadiness on feet (R26.81);History of falling (Z91.81);Muscle weakness (generalized) (M62.81)  Time: 9242-6834 OT Time Calculation (min): 33 min Charges:  OT General Charges $OT Visit: 1 Visit OT Evaluation $OT Eval Moderate Complexity: 1 Mod  Preciliano Castell H. OTR/L Supplemental OT, Department of rehab services [phone]  Ross Hefferan R H. 05/08/2020, 2:42 PM

## 2020-05-08 NOTE — Progress Notes (Signed)
Ok to change Megace to 400mg  for appetite stimulant per Dr. .  Ella Jubilee, PharmD, BCIDP, AAHIVP, CPP Infectious Disease Pharmacist 05/08/2020 8:01 AM

## 2020-05-08 NOTE — Progress Notes (Signed)
Initial Nutrition Assessment  DOCUMENTATION CODES:   Not applicable  INTERVENTION:   Change meals to assist and liberalize diet to REGULAR   Ensure Enlive po BID, each supplement provides 350 kcal and 20 grams of protein  Magic cup TID with meals, each supplement provides 290 kcal and 9 grams of protein  MVI daily   NUTRITION DIAGNOSIS:   Inadequate oral intake related to poor appetite as evidenced by per patient/family report.  GOAL:   Patient will meet greater than or equal to 90% of their needs  MONITOR:   PO intake, Supplement acceptance, Weight trends, Labs, I & O's  REASON FOR ASSESSMENT:   Consult Assessment of nutrition requirement/status  ASSESSMENT:   Patient with PMH significant for HTN, DM, AAA, and dementia. Presents this admission with new onset a.fib and failure to thrive.   Unable to obtain nutrition from pt. Per daughter pt lives in independent living facility and has lost weight recently. Per RN, pt asking for oatmeal and pancakes this am. Megace just increased. Plan to monitor meal completions and obtain nutrition history if possible. Send supplements to maximize kcal and protein this admission.   Records indicate pt weighed 67.1 kg on 5/10 and 66.7 kg this admission. Unsure of EDW.   Medications: 400 mg megace daily, metamucil Labs: Na 131 (L) K 2.9 (L)   Diet Order:   Diet Order            Diet Heart Room service appropriate? Yes; Fluid consistency: Thin  Diet effective now                 EDUCATION NEEDS:   Not appropriate for education at this time  Skin:  Skin Assessment: Skin Integrity Issues: Skin Integrity Issues:: Stage II, Other (Comment) Stage II: MASD-perineum, groin Other: sacrum  Last BM:  7/14  Height:   Ht Readings from Last 1 Encounters:  05/08/20 5\' 11"  (1.803 m)    Weight:   Wt Readings from Last 1 Encounters:  05/07/20 66.7 kg    BMI:  Body mass index is 20.51 kg/m.  Estimated Nutritional Needs:    Kcal:  1800-2000 kcal  Protein:  90-105 grams  Fluid:  >/= 1.8 L/day   05/09/20 RD, LDN Clinical Nutrition Pager listed in AMION

## 2020-05-08 NOTE — Consult Note (Signed)
Consultation Note Date: 05/08/2020   Patient Name: Joe Peters  DOB: 01/02/1928  MRN: [id number]  Age / Sex: 86 y.o., male  PCP: Martha Clan, MD Referring Physician: Coralie Keens  Reason for Consultation: Establishing goals of care  HPI/Patient Profile: 86 y.o. male  with past medical history of HTN, DM2, AAA rupture s/p grafting, dementia, POTS, from [facility],  admitted on 05/07/2020 with increasing weakness, frequent falls, and ongoing diarrhea. Workup reveals a fib, UTI, failure to thrive, diffuse bladder wall thickening, significant weight loss, posterior rib fractures. Palliative medicine consulted for goals of care.   Clinical Assessment and Goals of Care: Evaluated patient at bedside. He was meeting with physical therapy. Very pleasant. Needed assistance to for all ADL's and was incontinent. Only able to ambulate with assistance.  Spoke with his surrogate decision maker- Robin via phone. She is a Engineer, civil (consulting) at [employer] working in Health visitor. She noted that patient has declined greatly in the last several months. She is very clear that goals are for comfort. She wishes for his diarrhea to be improved. She wants for glucerna to be stopped. She is in agreement with Hospice care when he returns to [facility].   Primary Decision Maker NEXT OF KIN- daughter- Weldon Picking    SUMMARY OF RECOMMENDATIONS -Stop Glucerna -D/C to [facility] with Hospice -TOC referral for Hospice   -Recommend lomotil prn for diarrhea (C-diff negative) -Recommend treating UTI/cystitis for comfort  Code Status/Advance Care Planning:  DNR  Palliative Prophylaxis:   Delirium Protocol  Prognosis:    < 6 months  Discharge Planning: Skilled Nursing Facility with Hospice  Primary Diagnoses: Present on Admission: . New onset atrial fibrillation (HCC) . DM (diabetes mellitus), secondary,  uncontrolled, with neurologic complications (HCC) . HTN (hypertension) . POTS (postural orthostatic tachycardia syndrome) . BPH with urinary obstruction . Failure to thrive in adult   I have reviewed the medical record, interviewed the patient and family, and examined the patient. The following aspects are pertinent.  Past Medical History:  Diagnosis Date  . AAA (abdominal aortic aneurysm) (HCC)   . BPH (benign prostatic hyperplasia)   . Dementia (HCC)   . Diabetes mellitus without complication (HCC)   . Elevated PSA   . Hypertension   . POTS (postural orthostatic tachycardia syndrome)   . Thyroid disease    Scheduled Meds: . cilostazol  50 mg Oral BID  . donepezil  10 mg Oral QHS  . dutasteride  0.5 mg Oral Daily  . enoxaparin (LOVENOX) injection  40 mg Subcutaneous Q24H  . feeding supplement (ENSURE ENLIVE)  237 mL Oral BID BM  . fenofibrate  54 mg Oral Daily  . fludrocortisone  0.1 mg Oral Daily  . gabapentin  100 mg Oral BID  . levothyroxine  50 mcg Oral QAC breakfast  . megestrol  400 mg Oral Daily  . metoprolol tartrate  12.5 mg Oral BID  . multivitamin with minerals  1 tablet Oral Daily  . psyllium  1 packet Oral Daily  . sertraline  50 mg Oral Daily  . simvastatin  40 mg Oral Daily   Continuous Infusions: . sodium chloride    . cefTRIAXone (ROCEPHIN)  IV     PRN Meds:.acetaminophen **OR** acetaminophen, diphenoxylate-atropine, hydrALAZINE, HYDROcodone-acetaminophen, morphine injection, ondansetron **OR** ondansetron (ZOFRAN) IV, zolpidem Medications Prior to Admission:  Prior to Admission medications   Medication Sig Start Date End Date Taking? Authorizing Provider  cilostazol (PLETAL) 50 MG tablet Take 50 mg by mouth 2 (two) times daily.   Yes [provider]  donepezil (ARICEPT) 10 MG tablet Take 10 mg by mouth at bedtime.   Yes [provider]  fenofibrate 54 MG tablet Take 54 mg by mouth daily.   Yes [provider]    fludrocortisone (FLORINEF) 0.1 MG tablet Take 0.1 mg by mouth daily.  04/22/20  Yes [provider]  gabapentin (NEURONTIN) 100 MG capsule Take 100 mg by mouth 2 (two) times daily.   Yes [provider]  levothyroxine (SYNTHROID) 50 MCG tablet Take 50 mcg by mouth daily before breakfast.   Yes [provider]  metFORMIN (GLUCOPHAGE) 500 MG tablet Take 500 mg by mouth in the morning and at bedtime.   Yes [provider]  Multiple Vitamin (MULTIVITAMIN WITH MINERALS) TABS tablet Take 1 tablet by mouth daily. 03/10/20 06/08/20 Yes Hall, Carole N, DO  sertraline (ZOLOFT) 100 MG tablet Take 100 mg by mouth daily. 04/22/20  Yes [provider]  simvastatin (ZOCOR) 40 MG tablet Take 40 mg by mouth daily.   Yes [provider]  tamsulosin (FLOMAX) 0.4 MG CAPS capsule Take 0.4 mg by mouth daily.   Yes [provider]   Allergies  Allergen Reactions  . Penicillins Other (See Comments)    Reaction unknown -- occurred in childhood  . Sulfa Antibiotics Other (See Comments)    Reaction unknown -- occurred in childhood   Review of Systems  Unable to perform ROS: Dementia    Physical Exam Vitals and nursing note reviewed.  Constitutional:      Comments: frail  Musculoskeletal:     Comments: Generalized weakness  Neurological:     Mental Status: He is alert.     Vital Signs: BP (!) 139/56 (BP Location: Left Arm)   Pulse 76   Temp 98 F (36.7 C) (Oral)   Resp 17   Ht 5\' 11"  (1.803 m)   Wt 66.7 kg   SpO2 100%   BMI 20.51 kg/m  Pain Scale: 0-10   Pain Score: 0-No pain   SpO2: SpO2: 100 % O2 Device:SpO2: 100 % O2 Flow Rate: .   IO: Intake/output summary:   Intake/Output Summary (Last 24 hours) at 05/08/2020 1608 Last data filed at 05/08/2020 0739 Gross per 24 hour  Intake 250 ml  Output 775 ml  Net -525 ml    LBM: Last BM Date: 05/08/20 Baseline Weight: Weight: 66.7 kg Most recent weight: Weight: 66.7 kg      Palliative Assessment/Data: PPS: 20%     Thank you for this consult. Palliative medicine will continue to follow and assist as needed.   Time In: 1430 Time Out: 1530 Time Total: 60 mins Greater than 50%  of this time was spent counseling and coordinating care related to the above assessment and plan.  Signed by: 05/10/20, AGNP-C Palliative Medicine    Please contact Palliative Medicine Team phone at [phone] for questions and concerns.  For individual provider: See [phone]

## 2020-05-09 DIAGNOSIS — Z7189 Other specified counseling: Secondary | ICD-10-CM

## 2020-05-09 DIAGNOSIS — I10 Essential (primary) hypertension: Secondary | ICD-10-CM

## 2020-05-09 DIAGNOSIS — Z515 Encounter for palliative care: Secondary | ICD-10-CM

## 2020-05-09 LAB — BASIC METABOLIC PANEL
Anion gap: 8 (ref 5–15)
BUN: 13 mg/dL (ref 8–23)
CO2: 26 mmol/L (ref 22–32)
Calcium: 8.1 mg/dL — ABNORMAL LOW (ref 8.9–10.3)
Chloride: 100 mmol/L (ref 98–111)
Creatinine, Ser: 0.85 mg/dL (ref 0.61–1.24)
GFR calc Af Amer: >60 mL/min (ref >60)
GFR calc non Af Amer: >60 mL/min (ref >60)
Glucose, Bld: 183 mg/dL — ABNORMAL HIGH (ref 70–99)
Potassium: 3.2 mmol/L — ABNORMAL LOW (ref 3.5–5.1)
Sodium: 134 mmol/L — ABNORMAL LOW (ref 135–145)

## 2020-05-09 LAB — MAGNESIUM: Magnesium: 1.2 mg/dL — ABNORMAL LOW (ref 1.7–2.4)

## 2020-05-09 LAB — URINE CULTURE: Culture: >=100000 — AB

## 2020-05-09 MED ORDER — METOPROLOL TARTRATE 25 MG PO TABS: 12.5000 mg | ORAL_TABLET | Freq: Two times a day (BID) | ORAL | 0 refills | Status: AC

## 2020-05-09 MED ORDER — CEPHALEXIN 500 MG PO CAPS: 500.0000 mg | ORAL_CAPSULE | Freq: Two times a day (BID) | ORAL | 0 refills | Status: AC

## 2020-05-09 MED ORDER — CEPHALEXIN 500 MG PO CAPS
500.0000 mg | ORAL_CAPSULE | Freq: Two times a day (BID) | ORAL | Status: DC
  Administered 2020-05-09: 500 mg via ORAL
  Filled 2020-05-09: qty 1

## 2020-05-09 MED ORDER — MAGNESIUM SULFATE 2 GM/50ML IV SOLN
2.0000 g | Freq: Once | INTRAVENOUS | Status: AC
  Administered 2020-05-09: 2 g via INTRAVENOUS
  Filled 2020-05-09: qty 50

## 2020-05-09 MED ORDER — ENSURE ENLIVE PO LIQD: 237.0000 mL | Freq: Two times a day (BID) | ORAL | 12 refills | Status: DC

## 2020-05-09 MED ORDER — POTASSIUM CHLORIDE CRYS ER 20 MEQ PO TBCR
40.0000 meq | EXTENDED_RELEASE_TABLET | Freq: Once | ORAL | Status: AC
  Administered 2020-05-09: 40 meq via ORAL
  Filled 2020-05-09: qty 2

## 2020-05-09 NOTE — Progress Notes (Addendum)
Intermittent straight cath performed without any difficulties, got urine 300 ml, yellow with sediments.Pt tolerated well.  His vitals stable, remained afebrile, EKG: NSR with frequent PCA, HR 60s-70s, BP 142/80-156/95 mmHg, SPO2 96-97% on room air. No acute distress noted. We will continue to monitor.  Filiberto Pinks, RN

## 2020-05-09 NOTE — TOC Progression Note (Signed)
Transition of Care [facility]) - Progression Note    Patient Details  Name: Jushua Waltman MRN: [id number] Date of Birth: 1928/10/14  Transition of Care [facility] CM/SW Contact  Lockie Pares, RN Phone Number: 05/09/2020, 2:13 PM  Clinical Narrative:    Sherron Monday to Zella Ball , patient daughter, she states it is ok to use PTAR, she  Stated that was fine make sure Briana at [facility] is aware. Of patient coming back. Lauren, RN notified. She will call report.    Expected Discharge Plan: Home w Hospice Care Barriers to Discharge: No Barriers Identified  Expected Discharge Plan and Services Expected Discharge Plan: Home w Hospice Care   Discharge Planning Services: CM Consult   Living arrangements for the past 2 months: Independent Living Facility Expected Discharge Date: 05/09/20                 DME Agency: NA       HH Arranged:  ([facility] hosp[facility]) HH Agency:  Marcell Anger) Date HH Agency Contacted: 05/08/20 Time HH Agency Contacted: 1643 Representative spoke with at [facility] Agency: Trena Platt   Social Determinants of Health (SDOH) Interventions    Readmission Risk Interventions No flowsheet data found.

## 2020-05-09 NOTE — Discharge Summary (Signed)
Physician Discharge Summary  Joe Peters UUV:[id number] DOB: 08/30/1928 DOA: 05/07/2020  PCP: Martha Clan, MD  Admit date: 05/07/2020 Discharge date: 05/09/2020  Admitted From: Home  Disposition:  Home with hospice.   Recommendations for Outpatient Follow-up and new medication changes:  1. Follow up with Dr. Clelia Croft in 7 days.  2. Patient will continue taking cephalexin for urinary tract infection for 5 more days.  3. Continue self urinary bladder catheterization for urinary retention.  4. Patient has been placed on metoprolol 12.5 mg bid for rate control atrial fibrillation, not candidate for anticoagulation.  5. Patient with multiple medical problems, including dementia. He has poor prognosis and his family has decided to continue care as outpatient under hospice.   Home Health: na   Equipment/Devices: wheelchair    Discharge Condition: stable  CODE STATUS: dnr   Diet recommendation: heart healthy   Brief/Interim Summary: Patient admitted to the hospital with working diagnosis of new onset atrial fibrillation in the setting of urinary tract infection.   86 year old male with a past medical history for hypertension, type 2 diabetes mellitus, abdominal aortic aneurysm, chronic urinary retention and dementia.  Patient lives at a independent living facility, patient sustained multiple falls 4 days prior to hospitalization.  He has been experiencing diarrhea for 2 weeks. Recent hospitalization for autonomic dysfunction.  On his initial physical examination blood pressure 127/80, heart rate 103, respiratory rate 21, oxygen saturation 100%.  Heart S1-S2 present, irregularly irregular, lungs clear to auscultation bilaterally, abdomen soft, no lower extremity edema. Sodium 131, potassium 3.1, chloride 95 bicarb 24, glucose 181, BUN 15, creatinine 0.99, AST 15, ALT 13, white count 8.4, hemoglobin 10.9, hematocrit 33.2, platelets 222.  SARS COVID-19 negative.  Urinalysis more than 50 white cells,  0-5 red cells.  Stool negative for C. difficile.  CT of the abdomen pelvis with diffuse bladder wall thickening, trabeculation and perivascular fat stranding suspicion for cystitis.  Enlarged prostate, positive cholelithiasis.  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  Positive diverticulosis.  CT chest with descending aortic aneurysm with stent graft in place.  Left posterior 11th and 12th rib fractures. EKG-102 bpm, left axis deviation, left anterior fascicular block, right bundle branch block, atrial fibrillation rhythm, no significant ST segment T wave changes.  1.  New onset atrial fibrillation (paroxysmal)  Patient was admitted to progressive care unit, he was placed on low-dose metoprolol 12.5 mg twice daily with good toleration.  Patient converted to sinus rhythm.  He is not a candidate for anticoagulation due to fall risk and dementia along with ambulatory dysfunction.  Telemetry review at discharge sinus with PACs rhythm, rate 67 to 71 bpm.   2.  Complicated urinary tract infection/chronic urinary retention/ BPH.  Patient was placed on antibiotic therapy with intravenous ceftriaxone.  His urine culture was positive for E. coli which was pan-sensitive.  He has been transitioned to cephalexin to continue for 5 more days.  Continue tamsulosin, dutasteride and self bladder intermittent catheterization.  3.  Diarrhea with hypovolemia.  His diarrhea has been improving, patient tolerating p.o. diet adequately.  He received intravenous fluids with good toleration.  His GI panel including C. difficile was negative.  4.  Hypertension/orthostatic hypotension.  Antihypertensive agents were held, patient continue with Florinef.  He has been tolerating well metoprolol for rate control.  5.  Uncontrolled type 2 diabetes mellitus, hemoglobin A1c 7.5.  Dyslipidemia. Patient received insulin sliding scale during his hospitalization. At discharge will resume metformin.   Continue simvastatin and  fenofibrate.  6.  Hypothyroidism.  Continue levothyroxine.  7.  Chronic kidney disease stage IIIa with hyponatremia, hypokalemia, hypomagnesemia and nongap metabolic acidosis.  Electrolyte disturbances likely due to diarrhea. He received intravenous fluids and his electrolytes were corrected.   His discharge sodium was 134, potassium 3.2, chloride 100, bicarb 26, glucose 183, BUN 13, creatinine 0.85, magnesium 1.2. (patient will received 40 meq of potassium chloride and 2 grams of magnesium sulfate before discharge).  8.  Dementia with moderate calorie protein malnutrition.  Patient receives nutritional supplements, continue donepezil and sertraline.  No agitation.  9.  Stage II sacral pressure ulcer, present on admission.  Patient received local wound care.    Discharge Diagnoses:  Principal Problem:   New onset atrial fibrillation (HCC) Active Problems:   HTN (hypertension)   DM (diabetes mellitus), secondary, uncontrolled, with neurologic complications (HCC)   POTS (postural orthostatic tachycardia syndrome)   BPH with urinary obstruction   Failure to thrive in adult   Pressure injury of skin    Discharge Instructions   Allergies as of 05/09/2020      Reactions   Penicillins Other (See Comments)   Reaction unknown -- occurred in childhood   Sulfa Antibiotics Other (See Comments)   Reaction unknown -- occurred in childhood      Medication List    TAKE these medications   cephALEXin 500 MG capsule Commonly known as: KEFLEX Take 1 capsule (500 mg total) by mouth every 12 (twelve) hours for 5 days.   cilostazol 50 MG tablet Commonly known as: PLETAL Take 50 mg by mouth 2 (two) times daily.   donepezil 10 MG tablet Commonly known as: ARICEPT Take 10 mg by mouth at bedtime.   feeding supplement (ENSURE ENLIVE) Liqd Take 237 mLs by mouth 2 (two) times daily between meals.   fenofibrate 54 MG tablet Take 54 mg by mouth daily.   fludrocortisone 0.1 MG  tablet Commonly known as: FLORINEF Take 0.1 mg by mouth daily.   gabapentin 100 MG capsule Commonly known as: NEURONTIN Take 100 mg by mouth 2 (two) times daily.   metFORMIN 500 MG tablet Commonly known as: GLUCOPHAGE Take 500 mg by mouth in the morning and at bedtime.   metoprolol tartrate 25 MG tablet Commonly known as: LOPRESSOR Take 0.5 tablets (12.5 mg total) by mouth 2 (two) times daily.   multivitamin with minerals Tabs tablet Take 1 tablet by mouth daily.   sertraline 100 MG tablet Commonly known as: ZOLOFT Take 100 mg by mouth daily.   simvastatin 40 MG tablet Commonly known as: ZOCOR Take 40 mg by mouth daily.   Synthroid 50 MCG tablet Generic drug: levothyroxine Take 50 mcg by mouth daily before breakfast.   tamsulosin 0.4 MG Caps capsule Commonly known as: FLOMAX Take 0.4 mg by mouth daily.            Durable Medical Equipment  (From admission, onward)         Start     Ordered   05/09/20 0830  For home use only DME lightweight manual wheelchair with seat cushion  Once       Comments: Patient suffers from ambulatory dysfunction which impairs their ability to perform daily activities like bathing, dressing, feeding, grooming, and toileting in the home.  A walker will not resolve  issue with performing activities of daily living. A wheelchair will allow patient to safely perform daily activities. Patient is not able to propel themselves in the home using a standard weight  wheelchair due to general weakness. Patient can self propel in the lightweight wheelchair. Length of need 12 months . Accessories: elevating leg rests (ELRs), wheel locks, extensions and anti-tippers.   05/09/20 0830          Follow-up Information    Martha Clan, MD Follow up in 1 week(s).   Specialty: Internal Medicine Contact information: [address] [place] [address] [phone]              Allergies  Allergen Reactions  . Penicillins Other (See  Comments)    Reaction unknown -- occurred in childhood  . Sulfa Antibiotics Other (See Comments)    Reaction unknown -- occurred in childhood    Consultations:  Cardiology    Procedures/Studies: CT Head Wo Contrast  Result Date: 05/07/2020 CLINICAL DATA:  Provided history of motor neuron disease. Dementia patient with weakness. EXAM: CT HEAD WITHOUT CONTRAST TECHNIQUE: Contiguous axial images were obtained from the base of the skull through the vertex without intravenous contrast. COMPARISON:  Head CT 03/05/2020 FINDINGS: Brain: Stable degree of generalized atrophy and chronic small vessel ischemia. No intracranial hemorrhage, mass effect, or midline shift. No hydrocephalus. The basilar cisterns are patent. No evidence of territorial infarct or acute ischemia. No extra-axial or intracranial fluid collection. Vascular: Atherosclerosis of skullbase vasculature without hyperdense vessel or abnormal calcification. Skull: No fracture or focal lesion. Sinuses/Orbits: Minor mucosal thickening of ethmoid air cells. Trace opacification of mastoid air cells without significant effusion. Orbits are unremarkable. Bilateral cataract resection. Other: None. IMPRESSION: 1. No acute intracranial abnormality. 2. Stable atrophy and chronic small vessel ischemia. Electronically Signed   By: Narda Rutherford M.D.   On: 05/07/2020 20:57   CT Chest W Contrast  Result Date: 05/07/2020 CLINICAL DATA:  Fall 2 days ago. Provided history of abdominal mass, concern for abdominal aortic aneurysm. EXAM: CT CHEST WITH CONTRAST TECHNIQUE: Multidetector CT imaging of the chest was performed during intravenous contrast administration. Performed in conjunction with CT of the abdomen/pelvis. CONTRAST:  90mL OMNIPAQUE IOHEXOL 300 MG/ML  SOLN COMPARISON:  Chest radiograph earlier today. No prior cross-sectional imaging available. FINDINGS: Cardiovascular: Descending aortic aneurysm with stent graft in place. Residual aneurysm sac  measures 4.8 cm, for example series 6, image 88. No evidence of endoleak or contrast extending beyond the stent graft. Thoracic aorta is densely calcified. The ascending aorta is normal in caliber. Branch vessels are patent with atherosclerosis. The heart is normal in size. There are coronary artery calcifications. No pericardial effusion. No central pulmonary embolus to the lobar level. Mediastinum/Nodes: Calcified right hilar nodes consistent prior granulomatous disease. No noncalcified adenopathy. Small right thyroid nodule measures less than 1 cm, no further evaluation recommended given patient's advanced age and nodule size. No esophageal thickening. Lungs/Pleura: Small right pleural effusion. Adjacent compressive atelectasis with high density/calcifications within the atelectatic lung. Mild compressive atelectasis in the left lower lobe related to descending aortic tortuosity. Minimal tree-in-bud nodularity in the periphery of the right lower lobe, series 5, image 8 trace smooth septal thickening at the apices. No pneumothorax. No pulmonary mass. Upper Abdomen: Assessed on concurrent abdominal CT, reported separately. Musculoskeletal: Degenerative change in the spine. Left posterior eleventh and 12 rib fractures are acute or early subacute. IMPRESSION: 1. Descending aortic aneurysm with stent graft in place. Residual aneurysm sac measures 4.8 cm. No evidence of endoleak. 2. Small right pleural effusion with adjacent compressive atelectasis. High-density material/calcifications in the atelectatic lung may be related to prior granulomatous disease or can be seen in the  setting of aspiration. Recommend correlation for aspiration risk factors. Minimal tree-in-bud nodularity in the periphery of the right lower lobe may be infectious or inflammatory. 3. Left posterior 11th and 12 rib fractures are acute or early subacute. 4. Coronary artery calcifications. Aortic Atherosclerosis (ICD10-I70.0). Electronically Signed    By: Narda Rutherford M.D.   On: 05/07/2020 21:09   CT ABDOMEN PELVIS W CONTRAST  Result Date: 05/07/2020 CLINICAL DATA:  Abdominal mass, AAA suspected fall 2 days ago. EXAM: CT ABDOMEN AND PELVIS WITH CONTRAST TECHNIQUE: Multidetector CT imaging of the abdomen and pelvis was performed using the standard protocol following bolus administration of intravenous contrast. Performed in conjunction with CT of the chest, reported separately. CONTRAST:  90mL OMNIPAQUE IOHEXOL 300 MG/ML  SOLN COMPARISON:  None. FINDINGS: Lower chest: Assessed on concurrent chest CT, reported separately. Hepatobiliary: Tiny hypodensities in the left lobe of the liver, too small to characterize. 2 cm gallstone in the gallbladder. There is a Phrygian cap, possible gallbladder wall thickening. Motion limits assessment for pericholecystic inflammation. There is no biliary dilatation. Pancreas: No ductal dilatation or inflammation. Spleen: Normal in size.  Calcified granuloma. Adrenals/Urinary Tract: Normal adrenal glands. Mild bilateral renal parenchymal thinning. Small subcentimeter hypodensities are nonspecific but typically cysts. There are parapelvic cysts in the right kidney. Symmetric renal excretion on delayed phase imaging. Urinary bladder is physiologically distended. There is diffuse bladder wall thickening, trabeculation, and perivesicular fat stranding. There is air within the nondependent bladder and possibly bladder wall, series 5, image 68. Small bladder diverticula. Stomach/Bowel: Ingested material within the stomach. Mild tortuosity of the duodenum. There is no small bowel obstruction or inflammatory change. Appendix not visualized. Multifocal colonic diverticulosis without diverticulitis. Equivocal wall thickening of the sigmoid colon adjacent to bladder wall thickening, may simply be reactive. Small volume of colonic stool. Vascular/Lymphatic: Advanced aortic atherosclerosis. There is no aortic aneurysm, maximal abdominal  aortic dimension of 2.3 cm. No dissection or acute vascular findings. Right common iliac artery is dilated at 2 cm. The portal vein is patent. No evidence of portal or mesenteric venous gas. Few small retroperitoneal and central mesenteric nodes are not enlarged by size criteria. Reproductive: Enlarged prostate gland spanning 5.4 cm. Other: Fat stranding in the pelvis appears centered on the urinary bladder, with mild adjacent edema adjacent to the sigmoid colon in the presacral space. No free fluid/ascites. Musculoskeletal: Degenerative change throughout the spine and both hips. Left eleventh and twelfth rib fractures are acute or subacute. No acute fracture of the pelvis or spine. IMPRESSION: 1. Diffuse bladder wall thickening, trabeculation, and perivesicular fat stranding, suspicious for cystitis. There is air within the nondependent bladder and possibly bladder wall, suspicious for emphysematous cystitis. Recommend correlation with urinalysis. 2. Enlarged prostate gland. 3. Equivocal wall thickening of the sigmoid colon adjacent to the bladder wall thickening, may simply be reactive. 4. Cholelithiasis. There is a Phrygian cap, possible gallbladder wall thickening. Motion limits assessment for pericholecystic inflammation. Consider right upper quadrant ultrasound for further evaluation based on clinical concern. 5. Aortic atherosclerosis without abdominal aortic aneurysm. There is aneurysmal dilatation of the right common iliac artery 2 cm. 6. Colonic diverticulosis without diverticulitis. Aortic Atherosclerosis (ICD10-I70.0) and Emphysema (ICD10-J43.9). Electronically Signed   By: Narda Rutherford M.D.   On: 05/07/2020 21:23   DG Chest Port 1 View  Result Date: 05/07/2020 CLINICAL DATA:  Weakness today. EXAM: PORTABLE CHEST 1 VIEW COMPARISON:  PA and lateral chest 03/04/2020. FINDINGS: The lungs are clear. Aortic stent graft remains in place in the  descending thoracic aorta. A smoothly marginated ovoid  density along the lateral margin of the stent is unchanged and likely represents excluded aneurysm. Atherosclerosis noted. Heart size is normal. No acute or focal bony abnormality. IMPRESSION: No acute disease. Aortic Atherosclerosis (ICD10-I70.0). Electronically Signed   By: Drusilla Kanner M.D.   On: 05/07/2020 12:59   VAS [place] EVAR DUPLEX  Result Date: 04/25/2020 ABDOMINAL AORTA STUDY Indications: Follow up exam for EVAR. Risk Factors: Hypertension, past history of smoking. Other Factors: Dementia. Vascular Interventions: Patient is new to the area and had EVAR of a thoracic                         aortic aneurysm in [place]. Patient was recently in the                         ED with a TIA.  Performing Technologist: Hardie Lora RVT  Examination Guidelines: A complete evaluation includes B-mode imaging, spectral Doppler, color Doppler, and power Doppler as needed of all accessible portions of each vessel. Bilateral testing is considered an integral part of a complete examination. Limited examinations for reoccurring indications may be performed as noted.  Abdominal Aorta Findings: +-----------+-------+----------+----------+--------+--------+--------+ Location   AP (cm)Trans (cm)PSV (cm/s)WaveformThrombusComments +-----------+-------+----------+----------+--------+--------+--------+ Proximal   2.13   2.15      46                                 +-----------+-------+----------+----------+--------+--------+--------+ Mid        2.57   2.58      77                                 +-----------+-------+----------+----------+--------+--------+--------+ Distal     2.01   2.16      57                                 +-----------+-------+----------+----------+--------+--------+--------+ RT CIA Prox2.3    2.2       51                                 +-----------+-------+----------+----------+--------+--------+--------+ LT CIA Prox1.4    1.4       112                                 +-----------+-------+----------+----------+--------+--------+--------+ Unable to visualize stent graft in thoracic aorta.  Summary: Abdominal Aorta: No evidence of an abdominal aortic aneurysm was visualized. There is evidence of abnormal dilation of the Right Common Iliac artery. The largest aortic measurement is 2.6 cm.  *See table(s) above for measurements and observations.  Electronically signed by Fabienne Bruns MD on 04/25/2020 at 9:25:16 AM.   Final         Subjective: Patient is feeling better, diarrhea has been improving, no dyspnea or chest pain, no nausea or vomiting.   Discharge Exam: Vitals:   05/09/20 0340 05/09/20 0803  BP: (!) 156/94 136/72  Pulse: 77 67  Resp: 16 15  Temp: 97.6 F (36.4 C) 97.6 F (36.4 C)  SpO2: 99% 96%   Vitals:   05/08/20 1927 05/08/20 2313  05/09/20 0340 05/09/20 0803  BP: (!) 147/87 (!) 142/80 (!) 156/94 136/72  Pulse: 70 68 77 67  Resp: Temp: 98 F (36.7 C) 98.2 F (36.8 C) 97.6 F (36.4 C) 97.6 F (36.4 C)  TempSrc: Oral Oral Oral Oral  SpO2: 99% 99% 99% 96%  Weight:      Height:        General: Not in pain or dyspnea.  Neurology: Awake and alert, non focal  E ENT: no pallor, no icterus, oral mucosa moist Cardiovascular: No JVD. S1-S2 present, rhythmic, no gallops, rubs, or murmurs. No lower extremity edema. Pulmonary: positive breath sounds bilaterally, adequate air movement, no wheezing, rhonchi or rales. Gastrointestinal. Abdomen soft with no organomegaly, non tender, no rebound or guarding Skin. No rashes Musculoskeletal: no joint deformities   The results of significant diagnostics from this hospitalization (including imaging, microbiology, ancillary and laboratory) are listed below for reference.     Microbiology: Recent Results (from the past 240 hour(s))  Gastrointestinal Panel by PCR , Stool     Status: None   Collection Time: 05/07/20 10:16 AM   Specimen: STOOL  Result Value Ref Range Status    Campylobacter species NOT DETECTED NOT DETECTED Final   Plesimonas shigelloides NOT DETECTED NOT DETECTED Final   Salmonella species NOT DETECTED NOT DETECTED Final   Yersinia enterocolitica NOT DETECTED NOT DETECTED Final   Vibrio species NOT DETECTED NOT DETECTED Final   Vibrio cholerae NOT DETECTED NOT DETECTED Final   Enteroaggregative E coli (EAEC) NOT DETECTED NOT DETECTED Final   Enteropathogenic E coli (EPEC) NOT DETECTED NOT DETECTED Final   Enterotoxigenic E coli (ETEC) NOT DETECTED NOT DETECTED Final   Shiga like toxin producing E coli (STEC) NOT DETECTED NOT DETECTED Final   Shigella/Enteroinvasive E coli (EIEC) NOT DETECTED NOT DETECTED Final   Cryptosporidium NOT DETECTED NOT DETECTED Final   Cyclospora cayetanensis NOT DETECTED NOT DETECTED Final   Entamoeba histolytica NOT DETECTED NOT DETECTED Final   Giardia lamblia NOT DETECTED NOT DETECTED Final   Adenovirus F40/41 NOT DETECTED NOT DETECTED Final   Astrovirus NOT DETECTED NOT DETECTED Final   Norovirus GI/GII NOT DETECTED NOT DETECTED Final   Rotavirus A NOT DETECTED NOT DETECTED Final   Sapovirus (I, II, IV, and V) NOT DETECTED NOT DETECTED Final    Comment: Performed at [facility], [address] [place], [place] [address]  Urine Culture     Status: Abnormal   Collection Time: 05/07/20  2:42 PM   Specimen: Urine, Catheterized  Result Value Ref Range Status   Specimen Description URINE, CATHETERIZED  Final   Special Requests   Final    NONE Performed at [facility], [address], [place], [place] [address]    Culture >=100,000 COLONIES/mL ESCHERICHIA COLI (A)  Final   Report Status 05/09/2020 FINAL  Final   Organism ID, Bacteria ESCHERICHIA COLI (A)  Final      Susceptibility   Escherichia coli - MIC*    AMPICILLIN <=2 SENSITIVE Sensitive     CEFAZOLIN <=4 SENSITIVE Sensitive     CEFTRIAXONE <=0.25 SENSITIVE Sensitive     CIPROFLOXACIN <=0.25 SENSITIVE Sensitive     GENTAMICIN <=1  SENSITIVE Sensitive     IMIPENEM <=0.25 SENSITIVE Sensitive     NITROFURANTOIN <=16 SENSITIVE Sensitive     TRIMETH/SULFA <=20 SENSITIVE Sensitive     AMPICILLIN/SULBACTAM <=2 SENSITIVE Sensitive     PIP/TAZO <=4 SENSITIVE Sensitive     * >=  100,000 COLONIES/mL ESCHERICHIA COLI  C Difficile Quick Screen w PCR reflex     Status: None   Collection Time: 05/07/20  2:43 PM   Specimen: STOOL  Result Value Ref Range Status   C Diff antigen NEGATIVE NEGATIVE Final   C Diff toxin NEGATIVE NEGATIVE Final   C Diff interpretation No C. difficile detected.  Final    Comment: Performed at [facility], [address], [place], [place] [address]  Respiratory Panel by RT PCR (Flu A&B, Covid) - Nasopharyngeal Swab     Status: None   Collection Time: 05/07/20  6:30 PM   Specimen: Nasopharyngeal Swab  Result Value Ref Range Status   SARS Coronavirus 2 by RT PCR NEGATIVE NEGATIVE Final    Comment: (NOTE) SARS-CoV-2 target nucleic acids are NOT DETECTED.  The SARS-CoV-2 RNA is generally detectable in upper respiratoy specimens during the acute phase of infection. The lowest concentration of SARS-CoV-2 viral copies this assay can detect is 131 copies/mL. A negative result does not preclude SARS-Cov-2 infection and should not be used as the sole basis for treatment or other patient management decisions. A negative result may occur with  improper specimen collection/handling, submission of specimen other than nasopharyngeal swab, presence of viral mutation(s) within the areas targeted by this assay, and inadequate number of viral copies (<131 copies/mL). A negative result must be combined with clinical observations, patient history, and epidemiological information. The expected result is Negative.  Fact Sheet for Patients:  https://www.moore.com/  Fact Sheet for Healthcare Providers:  https://www.young.biz/  This test is no t yet approved or cleared by  the [place] FDA and  has been authorized for detection and/or diagnosis of SARS-CoV-2 by FDA under an Emergency Use Authorization (EUA). This EUA will remain  in effect (meaning this test can be used) for the duration of the COVID-19 declaration under Section 564(b)(1) of the Act, 21 U.S.C. section 360bbb-3(b)(1), unless the authorization is terminated or revoked sooner.     Influenza A by PCR NEGATIVE NEGATIVE Final   Influenza B by PCR NEGATIVE NEGATIVE Final    Comment: (NOTE) The Xpert Xpress SARS-CoV-2/FLU/RSV assay is intended as an aid in  the diagnosis of influenza from Nasopharyngeal swab specimens and  should not be used as a sole basis for treatment. Nasal washings and  aspirates are unacceptable for Xpert Xpress SARS-CoV-2/FLU/RSV  testing.  Fact Sheet for Patients: https://www.moore.com/  Fact Sheet for Healthcare Providers: https://www.young.biz/  This test is not yet approved or cleared by the [place] FDA and  has been authorized for detection and/or diagnosis of SARS-CoV-2 by  FDA under an Emergency Use Authorization (EUA). This EUA will remain  in effect (meaning this test can be used) for the duration of the  Covid-19 declaration under Section 564(b)(1) of the Act, 21  U.S.C. section 360bbb-3(b)(1), unless the authorization is  terminated or revoked. Performed at [facility], [address], [place], [place] [address]      Labs: BNP (last 3 results) No results for input(s): BNP in the last 8760 hours. Basic Metabolic Panel: Recent Labs  Lab 05/07/20 1801 05/07/20 1823 05/08/20 1413 05/09/20 0422  NA 131* 131* 133* 134*  K 3.1* 2.9* 3.4* 3.2*  CL 97* 95* 96* 100  CO2 24  --  27 26  GLUCOSE 181* 179* 244* 183*  BUN 15 17 8 13   CREATININE 0.99 0.90 0.79 0.85  CALCIUM 8.2*  --  8.7* 8.1*  MG  --   --   --  1.2*   Liver Function Tests: Recent Labs  Lab 05/07/20 1801  AST 15  ALT 13   ALKPHOS 46  BILITOT 0.7  PROT 5.0*  ALBUMIN 2.4*   No results for input(s): LIPASE, AMYLASE in the last 168 hours. No results for input(s): AMMONIA in the last 168 hours. CBC: Recent Labs  Lab 05/07/20 1801 05/07/20 1823  WBC 8.4  --   HGB 10.9* 10.9*  HCT 33.2* 32.0*  MCV 90.0  --   PLT 222  --    Cardiac Enzymes: No results for input(s): CKTOTAL, CKMB, CKMBINDEX, TROPONINI in the last 168 hours. BNP: Invalid input(s): POCBNP CBG: No results for input(s): GLUCAP in the last 168 hours. D-Dimer No results for input(s): DDIMER in the last 72 hours. Hgb A1c No results for input(s): HGBA1C in the last 72 hours. Lipid Profile No results for input(s): CHOL, HDL, LDLCALC, TRIG, CHOLHDL, LDLDIRECT in the last 72 hours. Thyroid function studies Recent Labs    05/07/20 1238  TSH 3.554   Anemia work up No results for input(s): VITAMINB12, FOLATE, FERRITIN, TIBC, IRON, RETICCTPCT in the last 72 hours. Urinalysis    Component Value Date/Time   COLORURINE YELLOW 05/07/2020 1513   APPEARANCEUR CLOUDY (A) 05/07/2020 1513   LABSPEC 1.011 05/07/2020 1513   PHURINE 6.0 05/07/2020 1513   GLUCOSEU NEGATIVE 05/07/2020 1513   HGBUR SMALL (A) 05/07/2020 1513   BILIRUBINUR NEGATIVE 05/07/2020 1513   KETONESUR NEGATIVE 05/07/2020 1513   PROTEINUR 100 (A) 05/07/2020 1513   NITRITE NEGATIVE 05/07/2020 1513   LEUKOCYTESUR LARGE (A) 05/07/2020 1513   Sepsis Labs Invalid input(s): PROCALCITONIN,  WBC,  LACTICIDVEN Microbiology Recent Results (from the past 240 hour(s))  Gastrointestinal Panel by PCR , Stool     Status: None   Collection Time: 05/07/20 10:16 AM   Specimen: STOOL  Result Value Ref Range Status   Campylobacter species NOT DETECTED NOT DETECTED Final   Plesimonas shigelloides NOT DETECTED NOT DETECTED Final   Salmonella species NOT DETECTED NOT DETECTED Final   Yersinia enterocolitica NOT DETECTED NOT DETECTED Final   Vibrio species NOT DETECTED NOT DETECTED Final    Vibrio cholerae NOT DETECTED NOT DETECTED Final   Enteroaggregative E coli (EAEC) NOT DETECTED NOT DETECTED Final   Enteropathogenic E coli (EPEC) NOT DETECTED NOT DETECTED Final   Enterotoxigenic E coli (ETEC) NOT DETECTED NOT DETECTED Final   Shiga like toxin producing E coli (STEC) NOT DETECTED NOT DETECTED Final   Shigella/Enteroinvasive E coli (EIEC) NOT DETECTED NOT DETECTED Final   Cryptosporidium NOT DETECTED NOT DETECTED Final   Cyclospora cayetanensis NOT DETECTED NOT DETECTED Final   Entamoeba histolytica NOT DETECTED NOT DETECTED Final   Giardia lamblia NOT DETECTED NOT DETECTED Final   Adenovirus F40/41 NOT DETECTED NOT DETECTED Final   Astrovirus NOT DETECTED NOT DETECTED Final   Norovirus GI/GII NOT DETECTED NOT DETECTED Final   Rotavirus A NOT DETECTED NOT DETECTED Final   Sapovirus (I, II, IV, and V) NOT DETECTED NOT DETECTED Final    Comment: Performed at [facility], [address] [place], [place] [address]  Urine Culture     Status: Abnormal   Collection Time: 05/07/20  2:42 PM   Specimen: Urine, Catheterized  Result Value Ref Range Status   Specimen Description URINE, CATHETERIZED  Final   Special Requests   Final    NONE Performed at [facility], [address], [place], [place] [address]    Culture >=100,000 COLONIES/mL ESCHERICHIA COLI (  A)  Final   Report Status 05/09/2020 FINAL  Final   Organism ID, Bacteria ESCHERICHIA COLI (A)  Final      Susceptibility   Escherichia coli - MIC*    AMPICILLIN <=2 SENSITIVE Sensitive     CEFAZOLIN <=4 SENSITIVE Sensitive     CEFTRIAXONE <=0.25 SENSITIVE Sensitive     CIPROFLOXACIN <=0.25 SENSITIVE Sensitive     GENTAMICIN <=1 SENSITIVE Sensitive     IMIPENEM <=0.25 SENSITIVE Sensitive     NITROFURANTOIN <=16 SENSITIVE Sensitive     TRIMETH/SULFA <=20 SENSITIVE Sensitive     AMPICILLIN/SULBACTAM <=2 SENSITIVE Sensitive     PIP/TAZO <=4 SENSITIVE Sensitive     * >=100,000 COLONIES/mL ESCHERICHIA COLI   C Difficile Quick Screen w PCR reflex     Status: None   Collection Time: 05/07/20  2:43 PM   Specimen: STOOL  Result Value Ref Range Status   C Diff antigen NEGATIVE NEGATIVE Final   C Diff toxin NEGATIVE NEGATIVE Final   C Diff interpretation No C. difficile detected.  Final    Comment: Performed at [facility], [address] [place], [place] [address]  Respiratory Panel by RT PCR (Flu A&B, Covid) - Nasopharyngeal Swab     Status: None   Collection Time: 05/07/20  6:30 PM   Specimen: Nasopharyngeal Swab  Result Value Ref Range Status   SARS Coronavirus 2 by RT PCR NEGATIVE NEGATIVE Final    Comment: (NOTE) SARS-CoV-2 target nucleic acids are NOT DETECTED.  The SARS-CoV-2 RNA is generally detectable in upper respiratoy specimens during the acute phase of infection. The lowest concentration of SARS-CoV-2 viral copies this assay can detect is 131 copies/mL. A negative result does not preclude SARS-Cov-2 infection and should not be used as the sole basis for treatment or other patient management decisions. A negative result may occur with  improper specimen collection/handling, submission of specimen other than nasopharyngeal swab, presence of viral mutation(s) within the areas targeted by this assay, and inadequate number of viral copies (<131 copies/mL). A negative result must be combined with clinical observations, patient history, and epidemiological information. The expected result is Negative.  Fact Sheet for Patients:  https://www.moore.com/  Fact Sheet for Healthcare Providers:  https://www.young.biz/  This test is no t yet approved or cleared by the [place] FDA and  has been authorized for detection and/or diagnosis of SARS-CoV-2 by FDA under an Emergency Use Authorization (EUA). This EUA will remain  in effect (meaning this test can be used) for the duration of the COVID-19 declaration under Section 564(b)(1) of the  Act, 21 U.S.C. section 360bbb-3(b)(1), unless the authorization is terminated or revoked sooner.     Influenza A by PCR NEGATIVE NEGATIVE Final   Influenza B by PCR NEGATIVE NEGATIVE Final    Comment: (NOTE) The Xpert Xpress SARS-CoV-2/FLU/RSV assay is intended as an aid in  the diagnosis of influenza from Nasopharyngeal swab specimens and  should not be used as a sole basis for treatment. Nasal washings and  aspirates are unacceptable for Xpert Xpress SARS-CoV-2/FLU/RSV  testing.  Fact Sheet for Patients: https://www.moore.com/  Fact Sheet for Healthcare Providers: https://www.young.biz/  This test is not yet approved or cleared by the [place] FDA and  has been authorized for detection and/or diagnosis of SARS-CoV-2 by  FDA under an Emergency Use Authorization (EUA). This EUA will remain  in effect (meaning this test can be used) for the duration of the  Covid-19 declaration under Section 564(b)(1) of the Act,  21  U.S.C. section 360bbb-3(b)(1), unless the authorization is  terminated or revoked. Performed at [facility], [address] [place], [place] [address]      Time coordinating discharge: 45 minutes  SIGNED:   Coralie Keens, MD  [facility] 05/09/2020, 8:34 AM

## 2020-05-09 NOTE — Care Management (Addendum)
Attempted to call daughter about transport to [facility], no answer and mailbox is full, but see plan from hospice is to go by [facility] will call them directly for transport.

## 2020-05-09 NOTE — Progress Notes (Signed)
Pt discharged back to [facility]. Pt left with all of his belongings. Telemetry and IV removed.

## 2020-05-09 NOTE — Progress Notes (Signed)
Straight cath done. output of yellow urine with some sediment. Pt tolerated well.

## 2020-05-09 NOTE — TOC Transition Note (Signed)
Transition of Care [facility]) - CM/SW Discharge Note   Patient Details  Name: Joe Peters MRN: [id number] Date of Birth: 18-May-1928  Transition of Care [facility] CM/SW Contact:  Lockie Pares, RN Phone Number: 05/09/2020, 2:02 PM   Clinical Narrative:    [facility] called to transport patient to [facility] independent living with hospice.  Wheelchair ordered for patient by hospice. Number called and left a message with hospice worker Bo Merino. Jeanene Erb phone number for daughter. Mailbox full.could not leave a message. Will try another number.   Final next level of care: Home w Hospice Care Barriers to Discharge: No Barriers Identified   Patient Goals and CMS Choice Patient states their goals for this hospitalization and ongoing recovery are:: [facility] with hospice CMS Medicare.gov Compare Post Acute Care list provided to:: Patient Represenative (must comment) Choice offered to / list presented to : Adult Children  Discharge Placement                  Name of family member notified: Called daughter, no voicemail Water quality scientist full    Discharge Plan and Services   Discharge Planning Services: CM Consult              DME Agency: NA       HH Arranged:  ([facility] hospice) HH Agency:  Freight forwarder) Date HH Agency Contacted: 05/08/20 Time HH Agency Contacted: 1643 Representative spoke with at [facility] Agency: Trena Platt  Social Determinants of Health (SDOH) Interventions     Readmission Risk Interventions No flowsheet data found.

## 2020-05-09 NOTE — Progress Notes (Signed)
Report given to Briana at [facility]. All questions answered. Pt discharging via [facility].

## 2020-05-23 ENCOUNTER — Ambulatory Visit: Payer: Medicare Other | Admitting: Neurology

## 2020-09-02 ENCOUNTER — Encounter (HOSPITAL_COMMUNITY): Payer: Self-pay

## 2020-09-02 ENCOUNTER — Emergency Department (HOSPITAL_COMMUNITY)
Admission: EM | Admit: 2020-09-02 | Discharge: 2020-09-03 | Disposition: A | Discharge status: Home / Self Care | Attending: Emergency Medicine | Admitting: Emergency Medicine

## 2020-09-02 ENCOUNTER — Emergency Department (HOSPITAL_COMMUNITY)

## 2020-09-02 ENCOUNTER — Other Ambulatory Visit: Payer: Self-pay

## 2020-09-02 DIAGNOSIS — I10 Essential (primary) hypertension: Secondary | ICD-10-CM | POA: Insufficient documentation

## 2020-09-02 DIAGNOSIS — W19XXXA Unspecified fall, initial encounter: Secondary | ICD-10-CM | POA: Insufficient documentation

## 2020-09-02 DIAGNOSIS — R000 Tachycardia, unspecified: Secondary | ICD-10-CM | POA: Diagnosis not present

## 2020-09-02 DIAGNOSIS — S065X0A Traumatic subdural hemorrhage without loss of consciousness, initial encounter: Secondary | ICD-10-CM | POA: Insufficient documentation

## 2020-09-02 DIAGNOSIS — Z7984 Long term (current) use of oral hypoglycemic drugs: Secondary | ICD-10-CM | POA: Diagnosis not present

## 2020-09-02 DIAGNOSIS — Z87891 Personal history of nicotine dependence: Secondary | ICD-10-CM | POA: Diagnosis not present

## 2020-09-02 DIAGNOSIS — F0390 Unspecified dementia without behavioral disturbance: Secondary | ICD-10-CM | POA: Insufficient documentation

## 2020-09-02 DIAGNOSIS — S0990XA Unspecified injury of head, initial encounter: Secondary | ICD-10-CM | POA: Diagnosis present

## 2020-09-02 DIAGNOSIS — Z79899 Other long term (current) drug therapy: Secondary | ICD-10-CM | POA: Insufficient documentation

## 2020-09-02 DIAGNOSIS — S065X9A Traumatic subdural hemorrhage with loss of consciousness of unspecified duration, initial encounter: Secondary | ICD-10-CM

## 2020-09-02 DIAGNOSIS — Y92830 Public park as the place of occurrence of the external cause: Secondary | ICD-10-CM | POA: Insufficient documentation

## 2020-09-02 DIAGNOSIS — Z20822 Contact with and (suspected) exposure to covid-19: Secondary | ICD-10-CM | POA: Diagnosis not present

## 2020-09-02 DIAGNOSIS — R569 Unspecified convulsions: Secondary | ICD-10-CM | POA: Insufficient documentation

## 2020-09-02 DIAGNOSIS — E1149 Type 2 diabetes mellitus with other diabetic neurological complication: Secondary | ICD-10-CM | POA: Insufficient documentation

## 2020-09-02 DIAGNOSIS — S065XAA Traumatic subdural hemorrhage with loss of consciousness status unknown, initial encounter: Secondary | ICD-10-CM

## 2020-09-02 DIAGNOSIS — Z515 Encounter for palliative care: Secondary | ICD-10-CM

## 2020-09-02 LAB — PROTIME-INR
INR: 1.2 (ref 0.8–1.2)
Prothrombin Time: 15.1 seconds (ref 11.4–15.2)

## 2020-09-02 LAB — URINALYSIS, ROUTINE W REFLEX MICROSCOPIC
Bilirubin Urine: NEGATIVE
Glucose, UA: NEGATIVE mg/dL
Ketones, ur: NEGATIVE mg/dL
Nitrite: NEGATIVE
Protein, ur: 100 mg/dL — AB
Specific Gravity, Urine: 1.012 (ref 1.005–1.030)
pH: 7 (ref 5.0–8.0)

## 2020-09-02 LAB — COMPREHENSIVE METABOLIC PANEL
ALT: 20 U/L (ref 0–44)
AST: 25 U/L (ref 15–41)
Albumin: 3.1 g/dL — ABNORMAL LOW (ref 3.5–5.0)
Alkaline Phosphatase: 70 U/L (ref 38–126)
Anion gap: 16 — ABNORMAL HIGH (ref 5–15)
BUN: 17 mg/dL (ref 8–23)
CO2: 24 mmol/L (ref 22–32)
Calcium: 8.7 mg/dL — ABNORMAL LOW (ref 8.9–10.3)
Chloride: 98 mmol/L (ref 98–111)
Creatinine, Ser: 1.18 mg/dL (ref 0.61–1.24)
GFR, Estimated: 59 mL/min — ABNORMAL LOW (ref >60)
Glucose, Bld: 199 mg/dL — ABNORMAL HIGH (ref 70–99)
Potassium: 4 mmol/L (ref 3.5–5.1)
Sodium: 138 mmol/L (ref 135–145)
Total Bilirubin: 1.1 mg/dL (ref 0.3–1.2)
Total Protein: 5.9 g/dL — ABNORMAL LOW (ref 6.5–8.1)

## 2020-09-02 LAB — CBC WITH DIFFERENTIAL/PLATELET
Abs Immature Granulocytes: 0.06 10*3/uL (ref 0.00–0.07)
Basophils Absolute: 0.1 10*3/uL (ref 0.0–0.1)
Basophils Relative: 1 %
Eosinophils Absolute: 0 10*3/uL (ref 0.0–0.5)
Eosinophils Relative: 0 %
HCT: 39.2 % (ref 39.0–52.0)
Hemoglobin: 12.3 g/dL — ABNORMAL LOW (ref 13.0–17.0)
Immature Granulocytes: 1 %
Lymphocytes Relative: 18 %
Lymphs Abs: 2 10*3/uL (ref 0.7–4.0)
MCH: 31 pg (ref 26.0–34.0)
MCHC: 31.4 g/dL (ref 30.0–36.0)
MCV: 98.7 fL (ref 80.0–100.0)
Monocytes Absolute: 0.6 10*3/uL (ref 0.1–1.0)
Monocytes Relative: 5 %
Neutro Abs: 8.2 10*3/uL — ABNORMAL HIGH (ref 1.7–7.7)
Neutrophils Relative %: 75 %
Platelets: 236 10*3/uL (ref 150–400)
RBC: 3.97 MIL/uL — ABNORMAL LOW (ref 4.22–5.81)
RDW: 13.3 % (ref 11.5–15.5)
WBC: 10.6 10*3/uL — ABNORMAL HIGH (ref 4.0–10.5)
nRBC: 0 % (ref 0.0–0.2)

## 2020-09-02 LAB — CK: Total CK: 37 U/L — ABNORMAL LOW (ref 49–397)

## 2020-09-02 LAB — MAGNESIUM: Magnesium: 1.4 mg/dL — ABNORMAL LOW (ref 1.7–2.4)

## 2020-09-02 LAB — RESPIRATORY PANEL BY RT PCR (FLU A&B, COVID)
Influenza A by PCR: NEGATIVE
Influenza B by PCR: NEGATIVE
SARS Coronavirus 2 by RT PCR: NEGATIVE

## 2020-09-02 LAB — LACTIC ACID, PLASMA: Lactic Acid, Venous: 6.9 mmol/L (ref 0.5–1.9)

## 2020-09-02 LAB — CBG MONITORING, ED: Glucose-Capillary: 176 mg/dL — ABNORMAL HIGH (ref 70–99)

## 2020-09-02 MED ORDER — LEVETIRACETAM 500 MG PO TABS: 500.0000 mg | ORAL_TABLET | Freq: Two times a day (BID) | ORAL | 0 refills | Status: AC

## 2020-09-02 MED ORDER — SODIUM CHLORIDE 0.9 % IV SOLN: INTRAVENOUS | Status: DC

## 2020-09-02 MED ORDER — LEVETIRACETAM IN NACL 1000 MG/100ML IV SOLN
1000.0000 mg | Freq: Once | INTRAVENOUS | Status: AC
  Administered 2020-09-02: 1000 mg via INTRAVENOUS
  Filled 2020-09-02: qty 100

## 2020-09-02 MED ORDER — LORAZEPAM 2 MG/ML IJ SOLN
INTRAMUSCULAR | Status: AC
  Administered 2020-09-02: 1 mg
  Filled 2020-09-02: qty 1

## 2020-09-02 NOTE — ED Notes (Signed)
[facility] called @ 1826.

## 2020-09-02 NOTE — ED Provider Notes (Signed)
[facility] Provider Note   CSN: [id number] Arrival date & time: 09/02/20  1234     History Chief Complaint  Patient presents with  . Fall    [name] is a 86 y.o. male.  Pt presents to the ED today with from [facility].  He was found on the ground this morning around 0900.  It is unclear how long he was on the ground.  Initially, he was awake and alert.  Later, staff checked on him and he was altered.  Pt does not remember what happened.  Pt denies any pain.        Past Medical History:  Diagnosis Date  . AAA (abdominal aortic aneurysm) (HCC)   . BPH (benign prostatic hyperplasia)   . Dementia (HCC)   . Diabetes mellitus without complication (HCC)   . Elevated PSA   . Hypertension   . POTS (postural orthostatic tachycardia syndrome)   . Thyroid disease     Patient Active Problem List   Diagnosis Date Noted  . Advanced care planning/counseling discussion   . Goals of care, counseling/discussion   . Palliative care by specialist   . Pressure injury of skin 05/08/2020  . New onset atrial fibrillation (HCC) 05/07/2020  . POTS (postural orthostatic tachycardia syndrome) 05/07/2020  . BPH with urinary obstruction 05/07/2020  . Failure to thrive in adult 05/07/2020  . TIA (transient ischemic attack) 03/04/2020  . HTN (hypertension) 03/04/2020  . DM (diabetes mellitus), secondary, uncontrolled, with neurologic complications (HCC) 03/04/2020    Past Surgical History:  Procedure Laterality Date  . ABDOMINAL AORTIC ANEURYSM REPAIR    . APPENDECTOMY    . IR ANGIO INTRA EXTRACRAN SEL INTERNAL CAROTID BILAT MOD SED  03/07/2020  . IR ANGIO VERTEBRAL SEL SUBCLAVIAN INNOMINATE UNI R MOD SED  03/07/2020  . IR ANGIO VERTEBRAL SEL VERTEBRAL UNI L MOD SED  03/07/2020  . IR US GUIDE VASC ACCESS RIGHT  03/07/2020  . LUMBAR LAMINECTOMY    . peptic ulcer repair         Family History  Problem Relation Age of Onset  . Hypertension  Mother   . Hypertension Father     Social History   Tobacco Use  . Smoking status: Former Smoker    Packs/day: 1.00    Years: 25.00    Pack years: 25.00    Types: Cigarettes    Quit date: 12/25/2019    Years since quitting: 0.6  . Smokeless tobacco: Never Used  Vaping Use  . Vaping Use: Never used  Substance Use Topics  . Alcohol use: Not Currently  . Drug use: Never    Home Medications Prior to Admission medications   Medication Sig Start Date End Date Taking? Authorizing Provider  cilostazol (PLETAL) 50 MG tablet Take 50 mg by mouth 2 (two) times daily.   Yes [provider]  donepezil (ARICEPT) 10 MG tablet Take 10 mg by mouth at bedtime.   Yes [provider]  fenofibrate 54 MG tablet Take 54 mg by mouth daily.   Yes [provider]  gabapentin (NEURONTIN) 100 MG capsule Take 100 mg by mouth 2 (two) times daily.   Yes [provider]  levothyroxine (SYNTHROID) 50 MCG tablet Take 50 mcg by mouth daily before breakfast.   Yes [provider]  simvastatin (ZOCOR) 40 MG tablet Take 40 mg by mouth daily.   Yes [provider]  tamsulosin (FLOMAX) 0.4 MG CAPS capsule Take 0.4 mg by  mouth daily.   Yes [provider]  feeding supplement, ENSURE ENLIVE, (ENSURE ENLIVE) LIQD Take 237 mLs by mouth 2 (two) times daily between meals. 05/09/20   Arrien, York Ram, MD  fludrocortisone (FLORINEF) 0.1 MG tablet Take 0.1 mg by mouth daily.  04/22/20   [provider]  levETIRAcetam (KEPPRA) 500 MG tablet Take 1 tablet (500 mg total) by mouth 2 (two) times daily. 09/02/20   Jacalyn Lefevre, MD  metFORMIN (GLUCOPHAGE) 500 MG tablet Take 500 mg by mouth 2 (two) times daily with a meal.     [provider]  metoprolol tartrate (LOPRESSOR) 25 MG tablet Take 0.5 tablets (12.5 mg total) by mouth 2 (two) times daily. 05/09/20 06/08/20  Arrien, York Ram, MD  sertraline (ZOLOFT) 100 MG tablet Take 100 mg by mouth at  bedtime.  04/22/20   [provider]    Allergies    Penicillins and Sulfa antibiotics  Review of Systems   Review of Systems  Unable to perform ROS: Dementia  All other systems reviewed and are negative.   Physical Exam Updated Vital Signs BP (!) 150/88   Pulse (!) 117   Temp (!) 97.1 F (36.2 C) (Rectal)   Resp 16   SpO2 95%   Physical Exam Vitals and nursing note reviewed.  Constitutional:      Appearance: He is underweight.  HENT:     Head: Normocephalic and atraumatic.     Right Ear: External ear normal.     Left Ear: External ear normal.     Nose: Nose normal.     Mouth/Throat:     Mouth: Mucous membranes are moist.     Pharynx: Oropharynx is clear.  Eyes:     Extraocular Movements: Extraocular movements intact.     Conjunctiva/sclera: Conjunctivae normal.     Pupils: Pupils are equal, round, and reactive to light.  Neck:     Comments: In c-collar Cardiovascular:     Rate and Rhythm: Regular rhythm. Tachycardia present.     Pulses: Normal pulses.     Heart sounds: Normal heart sounds.  Pulmonary:     Effort: Pulmonary effort is normal.     Breath sounds: Normal breath sounds.  Abdominal:     General: Abdomen is flat. Bowel sounds are normal.     Palpations: Abdomen is soft.  Musculoskeletal:        General: Normal range of motion.  Skin:    General: Skin is warm.     Capillary Refill: Capillary refill takes less than 2 seconds.  Neurological:     Mental Status: He is alert.     Comments: Pt knows name     ED Results / Procedures / Treatments   Labs (all labs ordered are listed, but only abnormal results are displayed) Labs Reviewed  CBC WITH DIFFERENTIAL/PLATELET - Abnormal; Notable for the following components:      Result Value   WBC 10.6 (*)    RBC 3.97 (*)    Hemoglobin 12.3 (*)    Neutro Abs 8.2 (*)    All other components within normal limits  COMPREHENSIVE METABOLIC PANEL - Abnormal; Notable for the following components:    Glucose, Bld 199 (*)    Calcium 8.7 (*)    Total Protein 5.9 (*)    Albumin 3.1 (*)    GFR, Estimated 59 (*)    Anion gap 16 (*)    All other components within normal limits  MAGNESIUM - Abnormal; Notable for  the following components:   Magnesium 1.4 (*)    All other components within normal limits  URINALYSIS, ROUTINE W REFLEX MICROSCOPIC - Abnormal; Notable for the following components:   APPearance HAZY (*)    Hgb urine dipstick SMALL (*)    Protein, ur 100 (*)    Leukocytes,Ua SMALL (*)    Bacteria, UA MANY (*)    All other components within normal limits  CK - Abnormal; Notable for the following components:   Total CK 37 (*)    All other components within normal limits  LACTIC ACID, PLASMA - Abnormal; Notable for the following components:   Lactic Acid, Venous 6.9 (*)    All other components within normal limits  CBG MONITORING, ED - Abnormal; Notable for the following components:   Glucose-Capillary 176 (*)    All other components within normal limits  RESPIRATORY PANEL BY RT PCR (FLU A&B, COVID)  PROTIME-INR    EKG EKG Interpretation  Date/Time:  Monday September 02 2020 12:37:05 EST Ventricular Rate:  119 PR Interval:    QRS Duration: 139 QT Interval:  392 QTC Calculation: 552 R Axis:   -86 Text Interpretation: Junctional tachycardia RBBB and LAFB Since last tracing rate faster Confirmed by Jacalyn Lefevre [phone]) on 09/02/2020 1:19:23 PM   Radiology CT HEAD WO CONTRAST  Result Date: 09/02/2020 CLINICAL DATA:  New onset seizure activity today. EXAM: CT HEAD WITHOUT CONTRAST CT CERVICAL SPINE WITHOUT CONTRAST TECHNIQUE: Multidetector CT imaging of the head and cervical spine was performed following the standard protocol without intravenous contrast. Multiplanar CT image reconstructions of the cervical spine were also generated. COMPARISON:  Head CT scan 05/07/2020. FINDINGS: CT HEAD FINDINGS Brain: There is an extra-axial collection over the right cerebral  convexities measuring up to 1.4 cm in diameter. The collection is new since the prior head CT but predominantly low attenuating but does have a few small hyperattenuating areas. The collection results in mass effect on the right convexities but there is no right to left midline shift. No evidence of acute infarct or mass lesion is present. Vascular: Atherosclerosis noted. Skull: Intact.  No focal lesion. Sinuses/Orbits: Status post cataract surgery. Mild mucosal thickening in the maxillary sinuses is seen. The patient is status post maxillary antrostomy. Other: None. CT CERVICAL SPINE FINDINGS Alignment: Reversal of the normal cervical lordosis and 0.2 cm anterolisthesis C7 on T1 due to facet arthropathy noted. Skull base and vertebrae: No acute fracture. No primary bone lesion or focal pathologic process. Soft tissues and spinal canal: No prevertebral fluid or swelling. No visible canal hematoma. Disc levels:  Marked multilevel loss of disc space height. Upper chest: Lung apices demonstrate emphysematous disease. Other: None. IMPRESSION: Subdural hemorrhage on the right appears mainly subacute although there are some areas of acute appearing hemorrhage within it. No right to left midline shift or hydrocephalus. No other acute finding head or cervical spine. Atrophy and chronic microvascular ischemic change. Multilevel cervical spondylosis. Emphysema (ICD10-J43.9). Critical Value/emergent results were called by telephone at the time of interpretation on 09/02/2020 at 1:44 pm to provider Avanti Jetter , who verbally acknowledged these results. Electronically Signed   By: Drusilla Kanner M.D.   On: 09/02/2020 13:53   CT CERVICAL SPINE WO CONTRAST  Result Date: 09/02/2020 CLINICAL DATA:  New onset seizure activity today. EXAM: CT HEAD WITHOUT CONTRAST CT CERVICAL SPINE WITHOUT CONTRAST TECHNIQUE: Multidetector CT imaging of the head and cervical spine was performed following the standard protocol without  intravenous contrast. Multiplanar CT image  reconstructions of the cervical spine were also generated. COMPARISON:  Head CT scan 05/07/2020. FINDINGS: CT HEAD FINDINGS Brain: There is an extra-axial collection over the right cerebral convexities measuring up to 1.4 cm in diameter. The collection is new since the prior head CT but predominantly low attenuating but does have a few small hyperattenuating areas. The collection results in mass effect on the right convexities but there is no right to left midline shift. No evidence of acute infarct or mass lesion is present. Vascular: Atherosclerosis noted. Skull: Intact.  No focal lesion. Sinuses/Orbits: Status post cataract surgery. Mild mucosal thickening in the maxillary sinuses is seen. The patient is status post maxillary antrostomy. Other: None. CT CERVICAL SPINE FINDINGS Alignment: Reversal of the normal cervical lordosis and 0.2 cm anterolisthesis C7 on T1 due to facet arthropathy noted. Skull base and vertebrae: No acute fracture. No primary bone lesion or focal pathologic process. Soft tissues and spinal canal: No prevertebral fluid or swelling. No visible canal hematoma. Disc levels:  Marked multilevel loss of disc space height. Upper chest: Lung apices demonstrate emphysematous disease. Other: None. IMPRESSION: Subdural hemorrhage on the right appears mainly subacute although there are some areas of acute appearing hemorrhage within it. No right to left midline shift or hydrocephalus. No other acute finding head or cervical spine. Atrophy and chronic microvascular ischemic change. Multilevel cervical spondylosis. Emphysema (ICD10-J43.9). Critical Value/emergent results were called by telephone at the time of interpretation on 09/02/2020 at 1:44 pm to provider Blayn Whetsell , who verbally acknowledged these results. Electronically Signed   By: Drusilla Kanner M.D.   On: 09/02/2020 13:53    Procedures Procedures (including critical care time)  Medications  Ordered in ED Medications  0.9 %  sodium chloride infusion ( Intravenous New Bag/Given 09/02/20 1257)  LORazepam (ATIVAN) 2 MG/ML injection (1 mg  Given 09/02/20 1252)  levETIRAcetam (KEPPRA) IVPB 1000 mg/100 mL premix (0 mg Intravenous Stopped 09/02/20 1404)    ED Course  I have reviewed the triage vital signs and the nursing notes.  Pertinent labs & imaging results that were available during my care of the patient were reviewed by me and considered in my medical decision making (see chart for details).    MDM Rules/Calculators/A&P                          During exam, pt looked to the left and had a grand mal seizure.  No hx of seizure.  Seizure lasted about 1 minute.  Pt given 1 mg ativan and started on keppra.  CT scan does show a large SDH on the right.  No shift.   Pt d/w his daughter, Weldon Picking.  She does not want NS to be done.  She wants him to remain comfortable.  She requests admission to a hospice home.  I spoke with the hospice nurse and she will work on that.  The hospice nurse was able to find a bed at the hospice house in [place].  Pt is still quite somnolent.  If he is able to swallow, he will need to be on keppra.  If not, hospice can give him ativan.  CRITICAL CARE Performed by: Jacalyn Lefevre   Total critical care time: 30 minutes  Critical care time was exclusive of separately billable procedures and treating other patients.  Critical care was necessary to treat or prevent imminent or life-threatening deterioration.  Critical care was time spent personally by me on the following  activities: development of treatment plan with patient and/or surrogate as well as nursing, discussions with consultants, evaluation of patient's response to treatment, examination of patient, obtaining history from patient or surrogate, ordering and performing treatments and interventions, ordering and review of laboratory studies, ordering and review of radiographic studies, pulse  oximetry and re-evaluation of patient's condition.  Final Clinical Impression(s) / ED Diagnoses Final diagnoses:  Seizure (HCC)  Subdural hematoma [facility])  Hospice care    Rx / DC Orders ED Discharge Orders         Ordered    levETIRAcetam (KEPPRA) 500 MG tablet  2 times daily        09/02/20 1500           Jacalyn Lefevre, MD 09/02/20 1616

## 2020-09-02 NOTE — Progress Notes (Addendum)
Civil engineer, [facility]) Hospital Liaison note    Mr. Symmonds currently receives hospice services at [facility] through [facility]. Per report from Dr. Particia Nearing pt is eligible for residential hospice, with a life expectancy of two weeks or less.  Chart reviewed and eligibility confirmed. Spoke with family to confirm interest and explain services. Family agreeable to transfer today to [facility] in [place], pending negative covid test.. TOC aware.    Please use [facility] for transportation; [facility] contracts with them for our hospice patients.  Please arrange transport after 6pm.  Please be sure DNR form transports with patient.  Thank you for the opportunity to participate in this patient's care.  Chrislyn Brooke Dare, BSN, RN [facility] Liaison (listed on [facility] under [facility]osp[facility])    [phone]

## 2020-09-02 NOTE — ED Notes (Signed)
[facility] called/canceled @ [phone]

## 2020-09-02 NOTE — ED Triage Notes (Signed)
PT BIB EMS from [facility] at [place]. Pt is usually a&ox4. Pt had a fall this morning at 9am and at that time daughter didn't want him taken to the hospital. Pt was brought in due to altered mental status.

## 2020-09-02 NOTE — ED Notes (Signed)
Palliative Care Transport called @ [phone]

## 2020-09-03 NOTE — ED Notes (Signed)
Pt d/c by MD and transported to hospice house in [place] by [facility].

## 2020-09-03 NOTE — ED Provider Notes (Signed)
86yo male awaiting transport to [facility] in [place] with subdural hematoma and seizures.  Please see Dr. Ceasar Lund note for details.   Alvira Monday, MD 09/03/20 [phone]

## 2020-09-25 DEATH — deceased

## 2020-12-17 ENCOUNTER — Other Ambulatory Visit: Payer: Self-pay

## 2020-12-17 DIAGNOSIS — I712 Thoracic aortic aneurysm, without rupture, unspecified: Secondary | ICD-10-CM

## 2021-01-16 ENCOUNTER — Ambulatory Visit: Admitting: Vascular Surgery

## 2022-03-30 IMAGING — CT CT CERVICAL SPINE W/O CM
3 series · 14 of 33 positions shown, 17 images · non-contrast
Comparison: Head CT scan 05/07/2020.

CLINICAL DATA: New onset seizure activity today.

EXAM:
CT HEAD WITHOUT CONTRAST
CT CERVICAL SPINE WITHOUT CONTRAST
TECHNIQUE: Multidetector CT imaging of the head and cervical spine was
performed following the standard protocol without intravenous
contrast. Multiplanar CT image reconstructions of the cervical spine
were also generated.

[Series 5: c spine soft · axial · 0.32mm/px · z∈[+1028,+1174]mm · 6 of 96 slices shown, 8 images]
[im 15/96  soft-tissue]
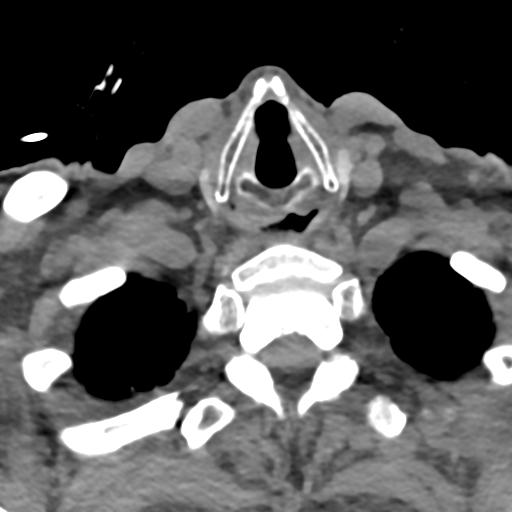
[im 15/96  bone]
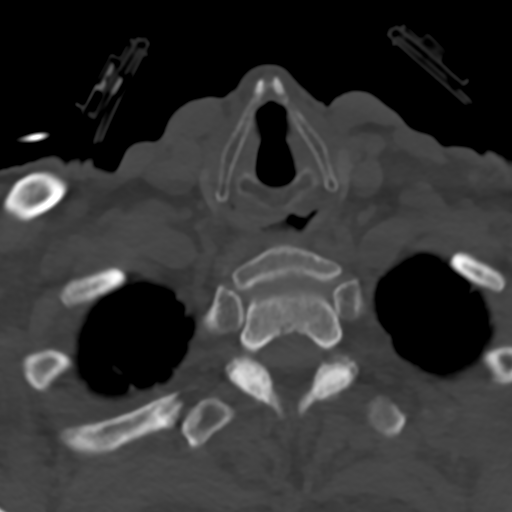
[im 30/96  bone]
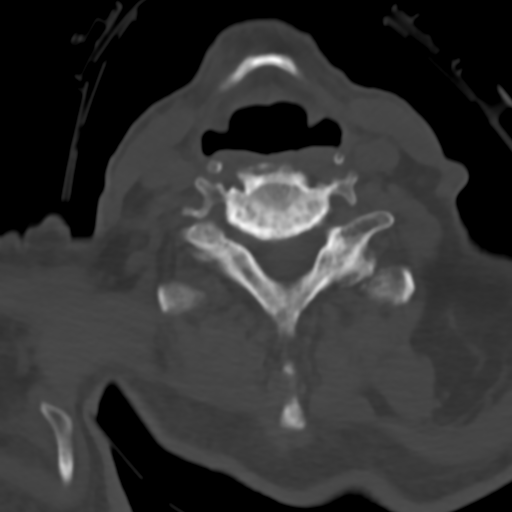
[im 44/96  bone]
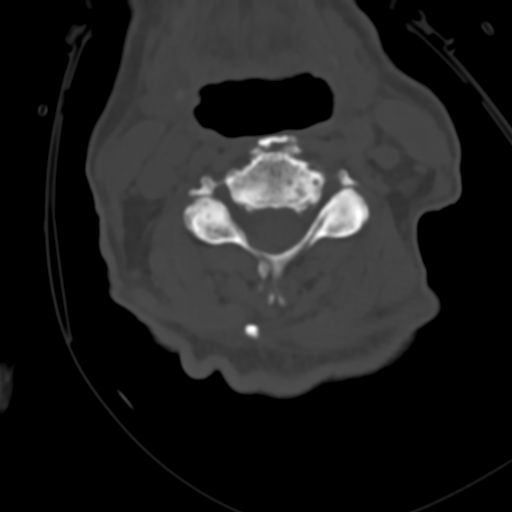
[im 59/96  bone]
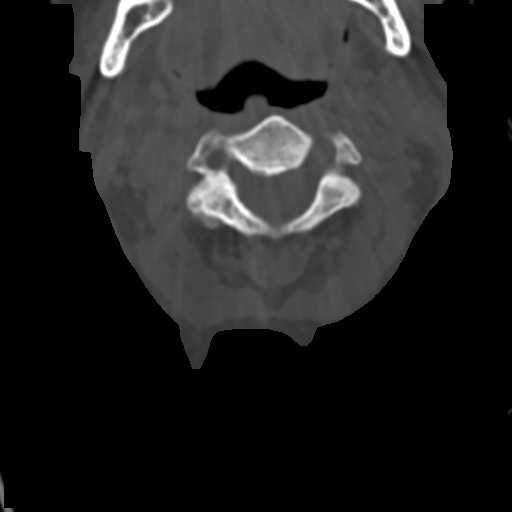
[im 74/96  soft-tissue]
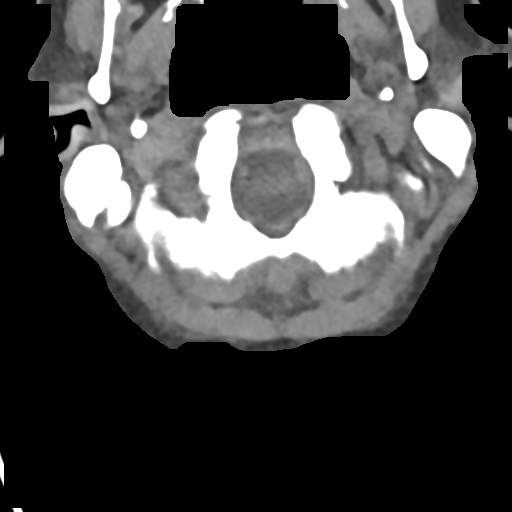
[im 74/96  bone]
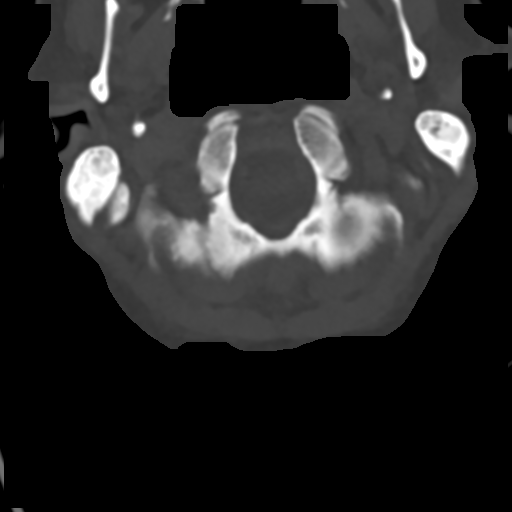
[im 88/96  bone]
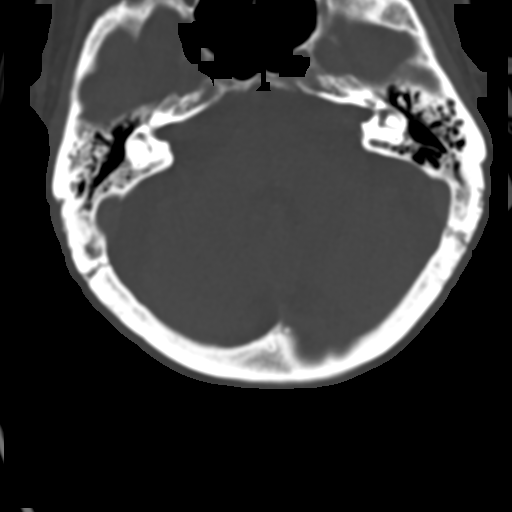

[Series 8: sag bone · sagittal · 0.35mm/px · 5 of 54 slices shown, 6 images]
[im 18/54  bone]
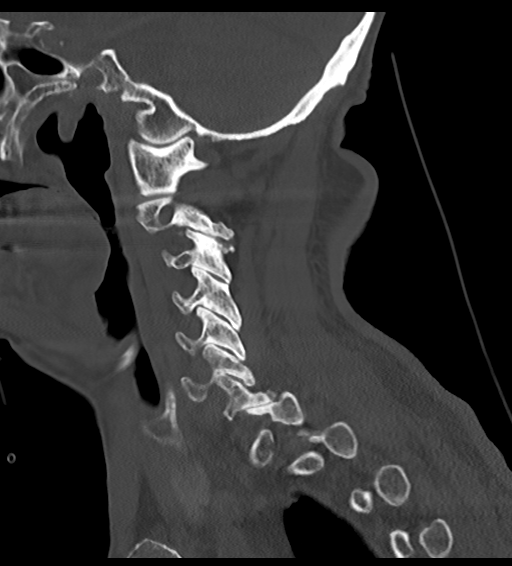
[im 23/54  bone]
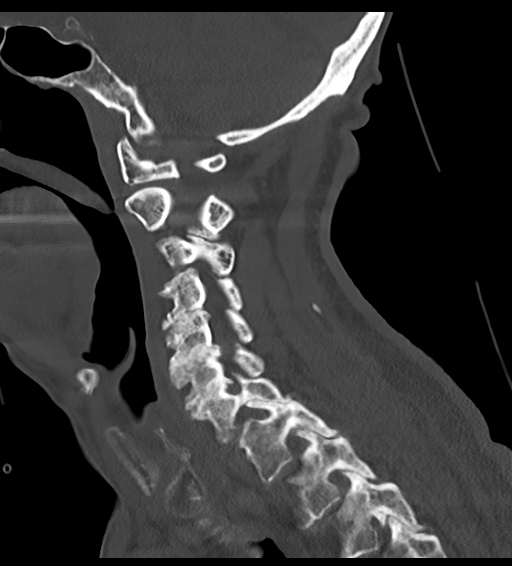
[im 27/54  soft-tissue]
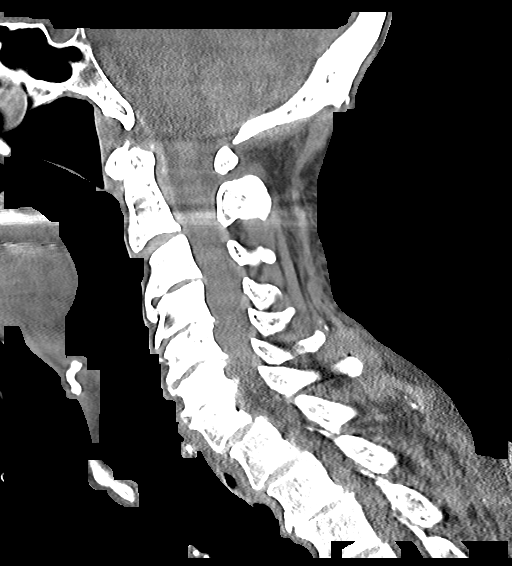
[im 27/54  bone]
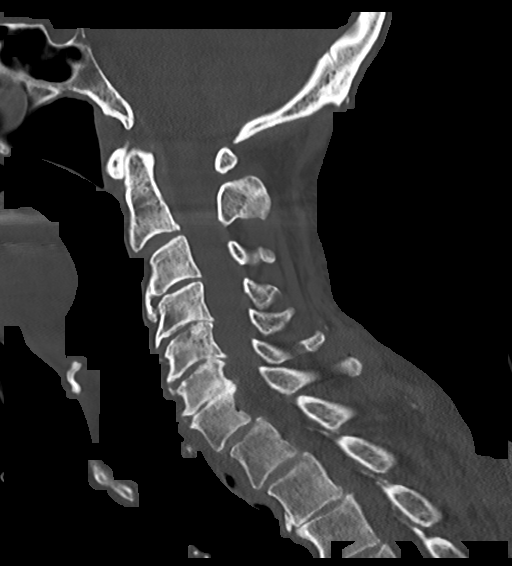
[im 31/54  bone]
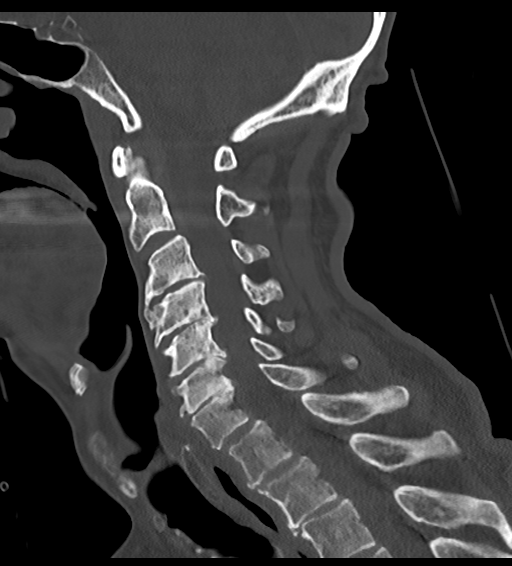
[im 36/54  bone]
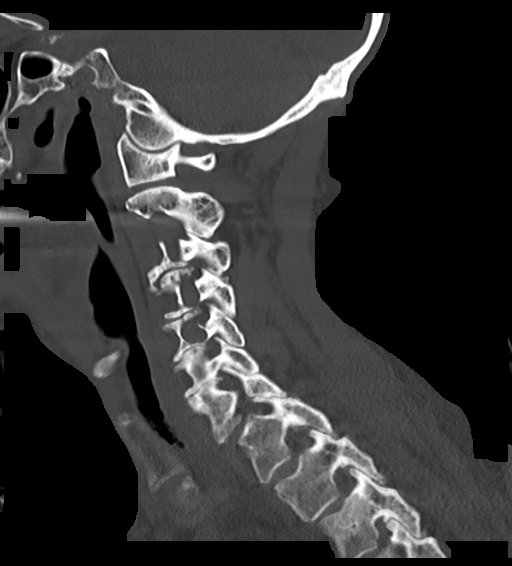

[Series 9: cor bone · coronal · 0.35mm/px · 3 of 60 slices shown]
[im 12/60  bone]
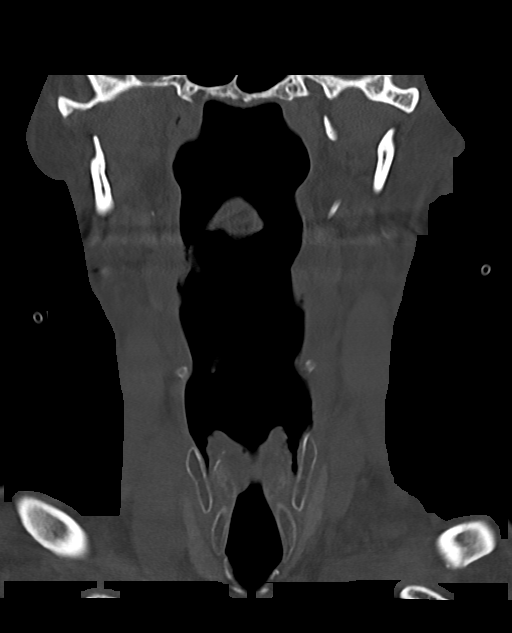
[im 24/60  bone]
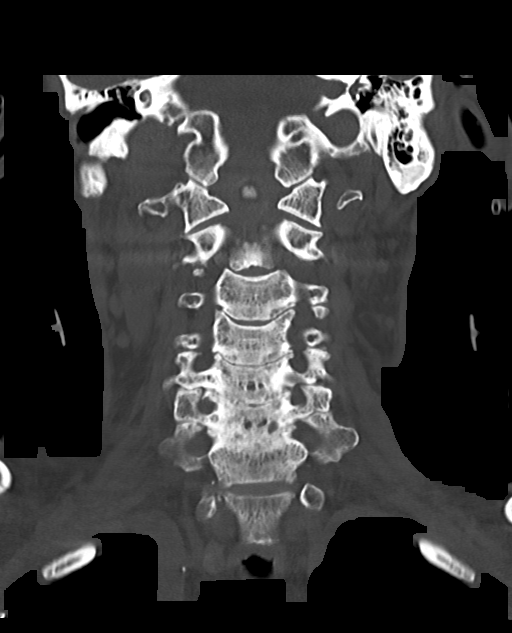
[im 36/60  bone]
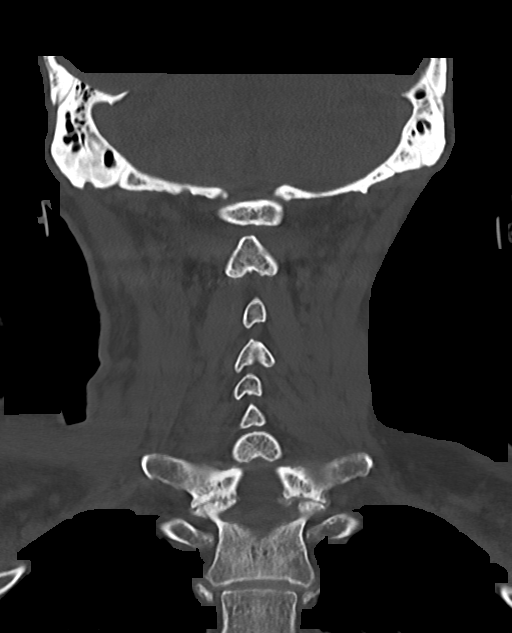

[14 of 33 positions shown; findings below may reference images not displayed]

FINDINGS: CT HEAD FINDINGS

Brain: There is an extra-axial collection over the right cerebral
convexities measuring up to 1.4 cm in diameter. The collection is
new since the prior head CT but predominantly low attenuating but
does have a few small hyperattenuating areas. The collection results
in mass effect on the right convexities but there is no right to
left midline shift. No evidence of acute infarct or mass lesion is
present.

Vascular: Atherosclerosis noted.

Skull: Intact.  No focal lesion.

Sinuses/Orbits: Status post cataract surgery. Mild mucosal
thickening in the maxillary sinuses is seen. The patient is status
post maxillary antrostomy.

Other: None.

CT CERVICAL SPINE FINDINGS

Alignment: Reversal of the normal cervical lordosis and 0.2 cm
anterolisthesis C7 on T1 due to facet arthropathy noted.

Skull base and vertebrae: No acute fracture. No primary bone lesion
or focal pathologic process.

Soft tissues and spinal canal: No prevertebral fluid or swelling. No
visible canal hematoma.

Disc levels:  Marked multilevel loss of disc space height.

Upper chest: Lung apices demonstrate emphysematous disease.

Other: None.
IMPRESSION: Subdural hemorrhage on the right appears mainly subacute although
there are some areas of acute appearing hemorrhage within it. No
right to left midline shift or hydrocephalus. No other acute finding
head or cervical spine.

Atrophy and chronic microvascular ischemic change.

Multilevel cervical spondylosis.

Emphysema ([id number]).

Critical Value/emergent results were called by telephone at the time
of interpretation on 09/02/2020 at [DATE] to provider FIGNOLER BALTAZA MYTHA
, who verbally acknowledged these results.
